# Patient Record
Sex: Female | Born: 1975 | Race: Black or African American | Hispanic: No | Marital: Married | Smoking: Never smoker
Health system: Southern US, Community
[De-identification: ages and names within clinical notes are randomized; demographics above are authoritative.]

## PROBLEM LIST (undated history)

## (undated) DIAGNOSIS — K219 Gastro-esophageal reflux disease without esophagitis: Secondary | ICD-10-CM

## (undated) DIAGNOSIS — D649 Anemia, unspecified: Secondary | ICD-10-CM

## (undated) DIAGNOSIS — R87619 Unspecified abnormal cytological findings in specimens from cervix uteri: Secondary | ICD-10-CM

## (undated) DIAGNOSIS — K449 Diaphragmatic hernia without obstruction or gangrene: Secondary | ICD-10-CM

## (undated) HISTORY — PX: APPENDECTOMY: SHX54

## (undated) HISTORY — DX: Unspecified abnormal cytological findings in specimens from cervix uteri: R87.619

---

## 2009-06-15 ENCOUNTER — Inpatient Hospital Stay: Payer: Self-pay | Admitting: Vascular Surgery

## 2011-07-25 ENCOUNTER — Emergency Department: Payer: Self-pay | Admitting: Emergency Medicine

## 2012-08-14 LAB — HM PAP SMEAR

## 2012-08-15 ENCOUNTER — Ambulatory Visit: Payer: Self-pay | Admitting: Family Medicine

## 2015-10-15 ENCOUNTER — Other Ambulatory Visit: Payer: Self-pay | Admitting: Family Medicine

## 2015-10-15 NOTE — Telephone Encounter (Signed)
Needs an appointment. Will get her enough medicine to make it to appointment when it's booked.   

## 2015-10-16 NOTE — Telephone Encounter (Signed)
Called and left patient a voicemail asking for her to please return my call to schedule a follow-up visit for medication refill.

## 2015-10-20 NOTE — Telephone Encounter (Signed)
Pt scheduled to come in 10/26/15

## 2015-10-21 ENCOUNTER — Telehealth: Payer: Self-pay | Admitting: Family Medicine

## 2015-10-21 NOTE — Telephone Encounter (Signed)
Pt called stated she has an appt scheduled for 10/26/15, pt wants to know if an RX can be sent to [facility] on [address] in [place]. Thanks.

## 2015-10-22 MED ORDER — NORETHIN ACE-ETH ESTRAD-FE 1-20 MG-MCG PO TABS: 1.0000 | ORAL_TABLET | Freq: Every day | ORAL | Status: DC

## 2015-10-22 NOTE — Telephone Encounter (Signed)
Rx sent to her pharmacy 

## 2015-10-22 NOTE — Telephone Encounter (Signed)
Checked Practice Partner  Junel FE 1/20

## 2015-10-26 ENCOUNTER — Ambulatory Visit (INDEPENDENT_AMBULATORY_CARE_PROVIDER_SITE_OTHER): Payer: PRIVATE HEALTH INSURANCE | Admitting: Family Medicine

## 2015-10-26 ENCOUNTER — Other Ambulatory Visit: Payer: Self-pay | Admitting: Family Medicine

## 2015-10-26 ENCOUNTER — Encounter: Payer: Self-pay | Admitting: Family Medicine

## 2015-10-26 VITALS — BP 125/76 | HR 65 | Temp 98.9°F | Ht 62.3 in | Wt 193.0 lb

## 2015-10-26 DIAGNOSIS — Z13220 Encounter for screening for lipoid disorders: Secondary | ICD-10-CM

## 2015-10-26 DIAGNOSIS — Z0000 Encounter for general adult medical examination without abnormal findings: Secondary | ICD-10-CM

## 2015-10-26 DIAGNOSIS — Z1239 Encounter for other screening for malignant neoplasm of breast: Secondary | ICD-10-CM | POA: Diagnosis not present

## 2015-10-26 DIAGNOSIS — N898 Other specified noninflammatory disorders of vagina: Secondary | ICD-10-CM

## 2015-10-26 DIAGNOSIS — G43109 Migraine with aura, not intractable, without status migrainosus: Secondary | ICD-10-CM | POA: Diagnosis not present

## 2015-10-26 DIAGNOSIS — Z124 Encounter for screening for malignant neoplasm of cervix: Secondary | ICD-10-CM | POA: Diagnosis not present

## 2015-10-26 DIAGNOSIS — Z30018 Encounter for initial prescription of other contraceptives: Secondary | ICD-10-CM

## 2015-10-26 LAB — UA/M W/RFLX CULTURE, ROUTINE
BILIRUBIN UA: NEGATIVE
Glucose, UA: NEGATIVE
KETONES UA: NEGATIVE
Leukocytes, UA: NEGATIVE
NITRITE UA: NEGATIVE
Protein, UA: NEGATIVE
RBC UA: NEGATIVE
SPEC GRAV UA: 1.02 (ref 1.005–1.030)
UUROB: 4 mg/dL — AB (ref 0.2–1.0)
pH, UA: 7 (ref 5.0–7.5)

## 2015-10-26 LAB — WET PREP FOR TRICH, YEAST, CLUE
CLUE CELL EXAM: NEGATIVE
TRICHOMONAS EXAM: NEGATIVE
YEAST EXAM: NEGATIVE

## 2015-10-26 LAB — HM PAP SMEAR: HM Pap smear: NEGATIVE

## 2015-10-26 MED ORDER — SUMATRIPTAN SUCCINATE 100 MG PO TABS: 100.0000 mg | ORAL_TABLET | ORAL | Status: DC | PRN

## 2015-10-26 MED ORDER — NORETHIN ACE-ETH ESTRAD-FE 1-20 MG-MCG PO TABS: 1.0000 | ORAL_TABLET | Freq: Every day | ORAL | Status: DC

## 2015-10-26 NOTE — Progress Notes (Signed)
BP 125/76 mmHg  Pulse 65  Temp(Src) 98.9 F (37.2 C)  Ht 5' 2.3" (1.582 m)  Wt 193 lb (87.544 kg)  BMI 34.98 kg/m2  SpO2 100%  LMP 10/10/2015 (Exact Date)   Subjective:    Patient ID: Bailey Perry, female    DOB: March 30, 1976, 39 y.o.   MRN: [id number]  HPI: Bailey Perry is a 39 y.o. female presenting on 10/26/2015 for comprehensive medical examination. Current medical complaints include:  CONTRACEPTION CONCERNS- has been having headaches, ankle pain, bloating, weight gain  Contraception: OCP Previous contraception: OCP Sexual activity: monogamous Average interval between menses: 28 days Length of menses: 1-2 days Flow: Clots Dysmenorrhea: none  Headaches Duration: about a year, come on about once a month Onset: sudden Severity: severe Quality: sharp and stabbing- at her temple- once last week, dull that effects her R eye Frequency: once a month Location: R side Headache duration: 1-2 days Radiation: yes, into her neck Time of day headache occurs: at random Alleviating factors: laying in dark Aggravating factors: light Headache status at time of visit: asymptomatic Treatments attempted: Treatments attempted: rest, APAP, ibuprofen and aleve", excedrine   Aura: yes Nausea:  no Vomiting: no Photophobia:  yes Phonophobia:  no Effect on social functioning:  no Numbers of missed days of school/work each month: no Confusion:  yes Gait disturbance/ataxia:  no Behavioral changes:  no Fevers:  no  She currently lives with: husband Menopausal Symptoms: yes  Depression Screen done today and results listed below:  Depression screen [facility] 2/9 10/26/2015  Decreased Interest 0  Down, Depressed, Hopeless 0  PHQ - 2 Score 0    The patient does not have a history of falls. I did not complete a risk assessment for falls. A plan of care for falls was not documented.  Past Medical History:  Past Medical History  Diagnosis Date  . Abnormal Pap smear of cervix      Surgical History:  Past Surgical History  Procedure Laterality Date  . Appendectomy    . Cesarean section      X 2    Medications:  Current Outpatient Prescriptions on File Prior to Visit  Medication Sig  . norethindrone-ethinyl estradiol (JUNEL FE 1/20) 1-20 MG-MCG tablet Take 1 tablet by mouth daily.   No current facility-administered medications on file prior to visit.    Allergies:  Allergies  Allergen Reactions  . Iodine Itching    Social History:  Social History   Social History  . Marital Status: Married    Spouse Name: N/A  . Number of Children: N/A  . Years of Education: N/A   Occupational History  . Not on file.   Social History Main Topics  . Smoking status: Never Smoker   . Smokeless tobacco: Not on file  . Alcohol Use: No  . Drug Use: No  . Sexual Activity: Not on file   Other Topics Concern  . Not on file   Social History Narrative   History  Smoking status  . Never Smoker   Smokeless tobacco  . Not on file   History  Alcohol Use No    Family History:  Family History  Problem Relation Age of Onset  . Diabetes Mother   . Heart disease Mother   . Hypertension Mother   . Cancer Father     Lymphoma    Past medical history, surgical history, medications, allergies, family history and social history reviewed with patient today and changes made to appropriate areas  of the chart.   Review of Systems  Constitutional: Positive for diaphoresis. Negative for fever, chills, weight loss and malaise/fatigue.  HENT: Negative for congestion, ear discharge, ear pain, hearing loss, nosebleeds, sore throat and tinnitus.   Eyes: Positive for blurred vision and photophobia. Negative for double vision, pain, discharge and redness.  Respiratory: Negative.  Negative for stridor.   Cardiovascular: Negative.   Gastrointestinal: Positive for heartburn and constipation. Negative for nausea, vomiting, abdominal pain, diarrhea, blood in stool and melena.   Genitourinary: Negative.   Musculoskeletal: Negative.   Skin: Negative.   Neurological: Positive for headaches. Negative for dizziness, tingling, tremors, sensory change, speech change, focal weakness, seizures, loss of consciousness and weakness.  Endo/Heme/Allergies: Negative.   Psychiatric/Behavioral: Negative.     All other ROS negative except what is listed above and in the HPI.      Objective:    BP 125/76 mmHg  Pulse 65  Temp(Src) 98.9 F (37.2 C)  Ht 5' 2.3" (1.582 m)  Wt 193 lb (87.544 kg)  BMI 34.98 kg/m2  SpO2 100%  LMP 10/10/2015 (Exact Date)  Wt Readings from Last 3 Encounters:  10/26/15 193 lb (87.544 kg)  10/03/14 187 lb (84.823 kg)    Physical Exam  Constitutional: She is oriented to person, place, and time. She appears well-developed and well-nourished. No distress.  HENT:  Head: Normocephalic and atraumatic.  Right Ear: Hearing and external ear normal.  Left Ear: Hearing and external ear normal.  Nose: Nose normal.  Mouth/Throat: Oropharynx is clear and moist. No oropharyngeal exudate.  Eyes: Conjunctivae, EOM and lids are normal. Pupils are equal, round, and reactive to light. Right eye exhibits no discharge. Left eye exhibits no discharge. No scleral icterus.  Neck: Normal range of motion. Neck supple. No JVD present. No tracheal deviation present. No thyromegaly present.  Cardiovascular: Normal rate, regular rhythm, normal heart sounds and intact distal pulses.  Exam reveals no gallop and no friction rub.   No murmur heard. Pulmonary/Chest: Effort normal and breath sounds normal. No stridor. No respiratory distress. She has no wheezes. She has no rales. She exhibits no tenderness.  Abdominal: Soft. Bowel sounds are normal. She exhibits no distension and no mass. There is no tenderness. There is no rebound and no guarding. Hernia confirmed negative in the right inguinal area and confirmed negative in the left inguinal area.  Genitourinary: Uterus normal.  No breast swelling, tenderness, discharge or bleeding. No labial fusion. There is no rash, tenderness, lesion or injury on the right labia. There is no rash, tenderness, lesion or injury on the left labia. Uterus is not deviated, not enlarged, not fixed and not tender. Cervix exhibits discharge and friability. Cervix exhibits no motion tenderness. Right adnexum displays no mass, no tenderness and no fullness. Left adnexum displays no mass, no tenderness and no fullness. No erythema, tenderness or bleeding in the vagina. No foreign body around the vagina. No signs of injury around the vagina. Vaginal discharge found.    Nodularity at 8 o'clock,   Musculoskeletal: Normal range of motion. She exhibits no edema or tenderness.  Lymphadenopathy:    She has no cervical adenopathy.       Right: No inguinal adenopathy present.       Left: No inguinal adenopathy present.  Neurological: She is alert and oriented to person, place, and time. She has normal reflexes. She displays normal reflexes. No cranial nerve deficit. She exhibits normal muscle tone. Coordination normal.  Skin: Skin is warm, dry and  intact. No rash noted. She is not diaphoretic. No erythema. No pallor.  Psychiatric: She has a normal mood and affect. Her speech is normal and behavior is normal. Judgment and thought content normal. Cognition and memory are normal.  Nursing note and vitals reviewed.   Results for orders placed or performed in visit on 10/23/15  HM PAP SMEAR  Result Value Ref Range   HM Pap smear PP       Assessment & Plan:   Problem List Items Addressed This Visit      Cardiovascular and Mediastinum   Migraine with aura and without status migrainosus, not intractable    Possibly due to OCP. Only happening about 1x per month. Will treat with imitrex. Follow up in 1 month to see how she's doing on the imitrex.       Relevant Medications   SUMAtriptan (IMITREX) 100 MG tablet    Other Visit Diagnoses    Routine  general medical examination at a health care facility    -  Primary    Pap done today. Mammo ordered. Vaccines up to date except flu which she gets elsewhere. Screening labs checked. Work on diet and exercise.     Relevant Orders    Lipid Panel w/o Chol/HDL Ratio    Screening for cholesterol level        Screening labs checked today.    Relevant Orders    Lipid Panel w/o Chol/HDL Ratio    Screening for cervical cancer        Pap done today. Checking HPV as well.     Relevant Orders    IGP, Aptima HPV, rfx 16/18,45    Screening for breast cancer        Mammogram ordered today.    Relevant Orders    MM DIGITAL SCREENING BILATERAL    Encounter for initial prescription of other contraceptives        Would like to have implant. Referral to GYN made today. Has seen [facility] before- would like to see them. She will call to get in with them.    Relevant Orders    Ambulatory referral to Gynecology    Vaginal discharge        Negative wet prep. Likely due to spermacidal foam. Stop use if irritating.     Relevant Orders    WET PREP FOR [place], YEAST, CLUE        Follow up plan: Return in about 4 weeks (around 11/23/2015) for migraine follow up.   LABORATORY TESTING:  - Pap smear: pap done  IMMUNIZATIONS:   - Tdap: Tetanus vaccination status reviewed: last tetanus booster within 10 years. - Influenza: Given elsewhere  SCREENING: -Mammogram: Ordered today   PATIENT COUNSELING:   Advised to take 1 mg of folate supplement per day if capable of pregnancy.   Sexuality: Discussed sexually transmitted diseases, partner selection, use of condoms, avoidance of unintended pregnancy  and contraceptive alternatives.   Advised to avoid cigarette smoking.  I discussed with the patient that most people either abstain from alcohol or drink within safe limits (<=14/week and <=4 drinks/occasion for males, <=7/weeks and <= 3 drinks/occasion for females) and that the risk for alcohol disorders and  other health effects rises proportionally with the number of drinks per week and how often a drinker exceeds daily limits.  Discussed cessation/primary prevention of drug use and availability of treatment for abuse.   Diet: Encouraged to adjust caloric intake to maintain  or achieve ideal body  weight, to reduce intake of dietary saturated fat and total fat, to limit sodium intake by avoiding high sodium foods and not adding table salt, and to maintain adequate dietary potassium and calcium preferably from fresh fruits, vegetables, and low-fat dairy products.    stressed the importance of regular exercise  Injury prevention: Discussed safety belts, safety helmets, smoke detector, smoking near bedding or upholstery.   Dental health: Discussed importance of regular tooth brushing, flossing, and dental visits.    NEXT PREVENTATIVE PHYSICAL DUE IN 1 YEAR. Return in about 4 weeks (around 11/23/2015) for migraine follow up.

## 2015-10-26 NOTE — Assessment & Plan Note (Signed)
Possibly due to OCP. Only happening about 1x per month. Will treat with imitrex. Follow up in 1 month to see how she's doing on the imitrex.

## 2015-10-26 NOTE — Patient Instructions (Signed)
Preventive Care for Adults, Female A healthy lifestyle and preventive care can promote health and wellness. Preventive health guidelines for women include the following key practices.  A routine yearly physical is a good way to check with your health care provider about your health and preventive screening. It is a chance to share any concerns and updates on your health and to receive a thorough exam.  Visit your dentist for a routine exam and preventive care every 6 months. Brush your teeth twice a day and floss once a day. Good oral hygiene prevents tooth decay and gum disease.  The frequency of eye exams is based on your age, health, family medical history, use of contact lenses, and other factors. Follow your health care provider's recommendations for frequency of eye exams.  Eat a healthy diet. Foods like vegetables, fruits, whole grains, low-fat dairy products, and lean protein foods contain the nutrients you need without too many calories. Decrease your intake of foods high in solid fats, added sugars, and salt. Eat the right amount of calories for you.Get information about a proper diet from your health care provider, if necessary.  Regular physical exercise is one of the most important things you can do for your health. Most adults should get at least 150 minutes of moderate-intensity exercise (any activity that increases your heart rate and causes you to sweat) each week. In addition, most adults need muscle-strengthening exercises on 2 or more days a week.  Maintain a healthy weight. The body mass index (BMI) is a screening tool to identify possible weight problems. It provides an estimate of body fat based on height and weight. Your health care provider can find your BMI and can help you achieve or maintain a healthy weight.For adults 20 years and older:  A BMI below 18.5 is considered underweight.  A BMI of 18.5 to 24.9 is normal.  A BMI of 25 to 29.9 is considered overweight.  A  BMI of 30 and above is considered obese.  Maintain normal blood lipids and cholesterol levels by exercising and minimizing your intake of saturated fat. Eat a balanced diet with plenty of fruit and vegetables. Blood tests for lipids and cholesterol should begin at age 45 and be repeated every 5 years. If your lipid or cholesterol levels are high, you are over 50, or you are at high risk for heart disease, you may need your cholesterol levels checked more frequently.Ongoing high lipid and cholesterol levels should be treated with medicines if diet and exercise are not working.  If you smoke, find out from your health care provider how to quit. If you do not use tobacco, do not start.  Lung cancer screening is recommended for adults aged 45-80 years who are at high risk for developing lung cancer because of a history of smoking. A yearly low-dose CT scan of the lungs is recommended for people who have at least a 30-pack-year history of smoking and are a current smoker or have quit within the past 15 years. A pack year of smoking is smoking an average of 1 pack of cigarettes a day for 1 year (for example: 1 pack a day for 30 years or 2 packs a day for 15 years). Yearly screening should continue until the smoker has stopped smoking for at least 15 years. Yearly screening should be stopped for people who develop a health problem that would prevent them from having lung cancer treatment.  If you are pregnant, do not drink alcohol. If you are  breastfeeding, be very cautious about drinking alcohol. If you are not pregnant and choose to drink alcohol, do not have more than 1 drink per day. One drink is considered to be 12 ounces (355 mL) of beer, 5 ounces (148 mL) of wine, or 1.5 ounces (44 mL) of liquor.  Avoid use of street drugs. Do not share needles with anyone. Ask for help if you need support or instructions about stopping the use of drugs.  High blood pressure causes heart disease and increases the risk  of stroke. Your blood pressure should be checked at least every 1 to 2 years. Ongoing high blood pressure should be treated with medicines if weight loss and exercise do not work.  If you are 55-79 years old, ask your health care provider if you should take aspirin to prevent strokes.  Diabetes screening is done by taking a blood sample to check your blood glucose level after you have not eaten for a certain period of time (fasting). If you are not overweight and you do not have risk factors for diabetes, you should be screened once every 3 years starting at age 45. If you are overweight or obese and you are 40-70 years of age, you should be screened for diabetes every year as part of your cardiovascular risk assessment.  Breast cancer screening is essential preventive care for women. You should practice "breast self-awareness." This means understanding the normal appearance and feel of your breasts and may include breast self-examination. Any changes detected, no matter how small, should be reported to a health care provider. Women in their 20s and 30s should have a clinical breast exam (CBE) by a health care provider as part of a regular health exam every 1 to 3 years. After age 40, women should have a CBE every year. Starting at age 40, women should consider having a mammogram (breast X-ray test) every year. Women who have a family history of breast cancer should talk to their health care provider about genetic screening. Women at a high risk of breast cancer should talk to their health care providers about having an MRI and a mammogram every year.  Breast cancer gene (BRCA)-related cancer risk assessment is recommended for women who have family members with BRCA-related cancers. BRCA-related cancers include breast, ovarian, tubal, and peritoneal cancers. Having family members with these cancers may be associated with an increased risk for harmful changes (mutations) in the breast cancer genes BRCA1 and  BRCA2. Results of the assessment will determine the need for genetic counseling and BRCA1 and BRCA2 testing.  Your health care provider may recommend that you be screened regularly for cancer of the pelvic organs (ovaries, uterus, and vagina). This screening involves a pelvic examination, including checking for microscopic changes to the surface of your cervix (Pap test). You may be encouraged to have this screening done every 3 years, beginning at age 21.  For women ages 30-65, health care providers may recommend pelvic exams and Pap testing every 3 years, or they may recommend the Pap and pelvic exam, combined with testing for human papilloma virus (HPV), every 5 years. Some types of HPV increase your risk of cervical cancer. Testing for HPV may also be done on women of any age with unclear Pap test results.  Other health care providers may not recommend any screening for nonpregnant women who are considered low risk for pelvic cancer and who do not have symptoms. Ask your health care provider if a screening pelvic exam is right for   you.  If you have had past treatment for cervical cancer or a condition that could lead to cancer, you need Pap tests and screening for cancer for at least 20 years after your treatment. If Pap tests have been discontinued, your risk factors (such as having a new sexual partner) need to be reassessed to determine if screening should resume. Some women have medical problems that increase the chance of getting cervical cancer. In these cases, your health care provider may recommend more frequent screening and Pap tests.  Colorectal cancer can be detected and often prevented. Most routine colorectal cancer screening begins at the age of 50 years and continues through age 75 years. However, your health care provider may recommend screening at an earlier age if you have risk factors for colon cancer. On a yearly basis, your health care provider may provide home test kits to check  for hidden blood in the stool. Use of a small camera at the end of a tube, to directly examine the colon (sigmoidoscopy or colonoscopy), can detect the earliest forms of colorectal cancer. Talk to your health care provider about this at age 50, when routine screening begins. Direct exam of the colon should be repeated every 5-10 years through age 75 years, unless early forms of precancerous polyps or small growths are found.  People who are at an increased risk for hepatitis B should be screened for this virus. You are considered at high risk for hepatitis B if:  You were born in a country where hepatitis B occurs often. Talk with your health care provider about which countries are considered high risk.  Your parents were born in a high-risk country and you have not received a shot to protect against hepatitis B (hepatitis B vaccine).  You have HIV or AIDS.  You use needles to inject street drugs.  You live with, or have sex with, someone who has hepatitis B.  You get hemodialysis treatment.  You take certain medicines for conditions like cancer, organ transplantation, and autoimmune conditions.  Hepatitis C blood testing is recommended for all people born from 1945 through 1965 and any individual with known risks for hepatitis C.  Practice safe sex. Use condoms and avoid high-risk sexual practices to reduce the spread of sexually transmitted infections (STIs). STIs include gonorrhea, chlamydia, syphilis, trichomonas, herpes, HPV, and human immunodeficiency virus (HIV). Herpes, HIV, and HPV are viral illnesses that have no cure. They can result in disability, cancer, and death.  You should be screened for sexually transmitted illnesses (STIs) including gonorrhea and chlamydia if:  You are sexually active and are younger than 24 years.  You are older than 24 years and your health care provider tells you that you are at risk for this type of infection.  Your sexual activity has changed  since you were last screened and you are at an increased risk for chlamydia or gonorrhea. Ask your health care provider if you are at risk.  If you are at risk of being infected with HIV, it is recommended that you take a prescription medicine daily to prevent HIV infection. This is called preexposure prophylaxis (PrEP). You are considered at risk if:  You are sexually active and do not regularly use condoms or know the HIV status of your partner(s).  You take drugs by injection.  You are sexually active with a partner who has HIV.  Talk with your health care provider about whether you are at high risk of being infected with HIV. If   you choose to begin PrEP, you should first be tested for HIV. You should then be tested every 3 months for as long as you are taking PrEP.  Osteoporosis is a disease in which the bones lose minerals and strength with aging. This can result in serious bone fractures or breaks. The risk of osteoporosis can be identified using a bone density scan. Women ages 67 years and over and women at risk for fractures or osteoporosis should discuss screening with their health care providers. Ask your health care provider whether you should take a calcium supplement or vitamin D to reduce the rate of osteoporosis.  Menopause can be associated with physical symptoms and risks. Hormone replacement therapy is available to decrease symptoms and risks. You should talk to your health care provider about whether hormone replacement therapy is right for you.  Use sunscreen. Apply sunscreen liberally and repeatedly throughout the day. You should seek shade when your shadow is shorter than you. Protect yourself by wearing long sleeves, pants, a wide-brimmed hat, and sunglasses year round, whenever you are outdoors.  Once a month, do a whole body skin exam, using a mirror to look at the skin on your back. Tell your health care provider of new moles, moles that have irregular borders, moles that  are larger than a pencil eraser, or moles that have changed in shape or color.  Stay current with required vaccines (immunizations).  Influenza vaccine. All adults should be immunized every year.  Tetanus, diphtheria, and acellular pertussis (Td, Tdap) vaccine. Pregnant women should receive 1 dose of Tdap vaccine during each pregnancy. The dose should be obtained regardless of the length of time since the last dose. Immunization is preferred during the 27th-36th week of gestation. An adult who has not previously received Tdap or who does not know her vaccine status should receive 1 dose of Tdap. This initial dose should be followed by tetanus and diphtheria toxoids (Td) booster doses every 10 years. Adults with an unknown or incomplete history of completing a 3-dose immunization series with Td-containing vaccines should begin or complete a primary immunization series including a Tdap dose. Adults should receive a Td booster every 10 years.  Varicella vaccine. An adult without evidence of immunity to varicella should receive 2 doses or a second dose if she has previously received 1 dose. Pregnant females who do not have evidence of immunity should receive the first dose after pregnancy. This first dose should be obtained before leaving the health care facility. The second dose should be obtained 4-8 weeks after the first dose.  Human papillomavirus (HPV) vaccine. Females aged 13-26 years who have not received the vaccine previously should obtain the 3-dose series. The vaccine is not recommended for use in pregnant females. However, pregnancy testing is not needed before receiving a dose. If a female is found to be pregnant after receiving a dose, no treatment is needed. In that case, the remaining doses should be delayed until after the pregnancy. Immunization is recommended for any person with an immunocompromised condition through the age of 61 years if she did not get any or all doses earlier. During the  3-dose series, the second dose should be obtained 4-8 weeks after the first dose. The third dose should be obtained 24 weeks after the first dose and 16 weeks after the second dose.  Zoster vaccine. One dose is recommended for adults aged 30 years or older unless certain conditions are present.  Measles, mumps, and rubella (MMR) vaccine. Adults born  before 1957 generally are considered immune to measles and mumps. Adults born in 1957 or later should have 1 or more doses of MMR vaccine unless there is a contraindication to the vaccine or there is laboratory evidence of immunity to each of the three diseases. A routine second dose of MMR vaccine should be obtained at least 28 days after the first dose for students attending postsecondary schools, health care workers, or international travelers. People who received inactivated measles vaccine or an unknown type of measles vaccine during 1963-1967 should receive 2 doses of MMR vaccine. People who received inactivated mumps vaccine or an unknown type of mumps vaccine before 1979 and are at high risk for mumps infection should consider immunization with 2 doses of MMR vaccine. For females of childbearing age, rubella immunity should be determined. If there is no evidence of immunity, females who are not pregnant should be vaccinated. If there is no evidence of immunity, females who are pregnant should delay immunization until after pregnancy. Unvaccinated health care workers born before 1957 who lack laboratory evidence of measles, mumps, or rubella immunity or laboratory confirmation of disease should consider measles and mumps immunization with 2 doses of MMR vaccine or rubella immunization with 1 dose of MMR vaccine.  Pneumococcal 13-valent conjugate (PCV13) vaccine. When indicated, a person who is uncertain of his immunization history and has no record of immunization should receive the PCV13 vaccine. All adults 65 years of age and older should receive this  vaccine. An adult aged 19 years or older who has certain medical conditions and has not been previously immunized should receive 1 dose of PCV13 vaccine. This PCV13 should be followed with a dose of pneumococcal polysaccharide (PPSV23) vaccine. Adults who are at high risk for pneumococcal disease should obtain the PPSV23 vaccine at least 8 weeks after the dose of PCV13 vaccine. Adults older than 41 years of age who have normal immune system function should obtain the PPSV23 vaccine dose at least 1 year after the dose of PCV13 vaccine.  Pneumococcal polysaccharide (PPSV23) vaccine. When PCV13 is also indicated, PCV13 should be obtained first. All adults aged 65 years and older should be immunized. An adult younger than age 65 years who has certain medical conditions should be immunized. Any person who resides in a nursing home or long-term care facility should be immunized. An adult smoker should be immunized. People with an immunocompromised condition and certain other conditions should receive both PCV13 and PPSV23 vaccines. People with human immunodeficiency virus (HIV) infection should be immunized as soon as possible after diagnosis. Immunization during chemotherapy or radiation therapy should be avoided. Routine use of PPSV23 vaccine is not recommended for American Indians, [place] Natives, or people younger than 65 years unless there are medical conditions that require PPSV23 vaccine. When indicated, people who have unknown immunization and have no record of immunization should receive PPSV23 vaccine. One-time revaccination 5 years after the first dose of PPSV23 is recommended for people aged 19-64 years who have chronic kidney failure, nephrotic syndrome, asplenia, or immunocompromised conditions. People who received 1-2 doses of PPSV23 before age 65 years should receive another dose of PPSV23 vaccine at age 65 years or later if at least 5 years have passed since the previous dose. Doses of PPSV23 are not  needed for people immunized with PPSV23 at or after age 65 years.  Meningococcal vaccine. Adults with asplenia or persistent complement component deficiencies should receive 2 doses of quadrivalent meningococcal conjugate (MenACWY-D) vaccine. The doses should be obtained   at least 2 months apart. Microbiologists working with certain meningococcal bacteria, [place] recruits, people at risk during an outbreak, and people who travel to or live in countries with a high rate of meningitis should be immunized. A first-year college student up through age 34 years who is living in a residence hall should receive a dose if she did not receive a dose on or after her 16th birthday. Adults who have certain high-risk conditions should receive one or more doses of vaccine.  Hepatitis A vaccine. Adults who wish to be protected from this disease, have certain high-risk conditions, work with hepatitis A-infected animals, work in hepatitis A research labs, or travel to or work in countries with a high rate of hepatitis A should be immunized. Adults who were previously unvaccinated and who anticipate close contact with an international adoptee during the first 60 days after arrival in the [place] from a country with a high rate of hepatitis A should be immunized.  Hepatitis B vaccine. Adults who wish to be protected from this disease, have certain high-risk conditions, may be exposed to blood or other infectious body fluids, are household contacts or sex partners of hepatitis B positive people, are clients or workers in certain care facilities, or travel to or work in countries with a high rate of hepatitis B should be immunized.  Haemophilus influenzae type b (Hib) vaccine. A previously unvaccinated person with asplenia or sickle cell disease or having a scheduled splenectomy should receive 1 dose of Hib vaccine. Regardless of previous immunization, a recipient of a hematopoietic stem cell transplant should receive a  3-dose series 6-12 months after her successful transplant. Hib vaccine is not recommended for adults with HIV infection. Preventive Services / Frequency Ages 35 to 4 years  Blood pressure check.** / Every 3-5 years.  Lipid and cholesterol check.** / Every 5 years beginning at age 60.  Clinical breast exam.** / Every 3 years for women in their 71s and 10s.  BRCA-related cancer risk assessment.** / For women who have family members with a BRCA-related cancer (breast, ovarian, tubal, or peritoneal cancers).  Pap test.** / Every 2 years from ages 76 through 26. Every 3 years starting at age 61 through age 76 or 93 with a history of 3 consecutive normal Pap tests.  HPV screening.** / Every 3 years from ages 37 through ages 60 to 51 with a history of 3 consecutive normal Pap tests.  Hepatitis C blood test.** / For any individual with known risks for hepatitis C.  Skin self-exam. / Monthly.  Influenza vaccine. / Every year.  Tetanus, diphtheria, and acellular pertussis (Tdap, Td) vaccine.** / Consult your health care provider. Pregnant women should receive 1 dose of Tdap vaccine during each pregnancy. 1 dose of Td every 10 years.  Varicella vaccine.** / Consult your health care provider. Pregnant females who do not have evidence of immunity should receive the first dose after pregnancy.  HPV vaccine. / 3 doses over 6 months, if 93 and younger. The vaccine is not recommended for use in pregnant females. However, pregnancy testing is not needed before receiving a dose.  Measles, mumps, rubella (MMR) vaccine.** / You need at least 1 dose of MMR if you were born in 1957 or later. You may also need a 2nd dose. For females of childbearing age, rubella immunity should be determined. If there is no evidence of immunity, females who are not pregnant should be vaccinated. If there is no evidence of immunity, females who are  pregnant should delay immunization until after pregnancy.  Pneumococcal  13-valent conjugate (PCV13) vaccine.** / Consult your health care provider.  Pneumococcal polysaccharide (PPSV23) vaccine.** / 1 to 2 doses if you smoke cigarettes or if you have certain conditions.  Meningococcal vaccine.** / 1 dose if you are age 68 to 8 years and a Market researcher living in a residence hall, or have one of several medical conditions, you need to get vaccinated against meningococcal disease. You may also need additional booster doses.  Hepatitis A vaccine.** / Consult your health care provider.  Hepatitis B vaccine.** / Consult your health care provider.  Haemophilus influenzae type b (Hib) vaccine.** / Consult your health care provider. Ages 7 to 53 years  Blood pressure check.** / Every year.  Lipid and cholesterol check.** / Every 5 years beginning at age 25 years.  Lung cancer screening. / Every year if you are aged 11-80 years and have a 30-pack-year history of smoking and currently smoke or have quit within the past 15 years. Yearly screening is stopped once you have quit smoking for at least 15 years or develop a health problem that would prevent you from having lung cancer treatment.  Clinical breast exam.** / Every year after age 48 years.  BRCA-related cancer risk assessment.** / For women who have family members with a BRCA-related cancer (breast, ovarian, tubal, or peritoneal cancers).  Mammogram.** / Every year beginning at age 41 years and continuing for as long as you are in good health. Consult with your health care provider.  Pap test.** / Every 3 years starting at age 65 years through age 37 or 70 years with a history of 3 consecutive normal Pap tests.  HPV screening.** / Every 3 years from ages 72 years through ages 60 to 40 years with a history of 3 consecutive normal Pap tests.  Fecal occult blood test (FOBT) of stool. / Every year beginning at age 21 years and continuing until age 5 years. You may not need to do this test if you get  a colonoscopy every 10 years.  Flexible sigmoidoscopy or colonoscopy.** / Every 5 years for a flexible sigmoidoscopy or every 10 years for a colonoscopy beginning at age 35 years and continuing until age 48 years.  Hepatitis C blood test.** / For all people born from [birth year] through 1965 and any individual with known risks for hepatitis C.  Skin self-exam. / Monthly.  Influenza vaccine. / Every year.  Tetanus, diphtheria, and acellular pertussis (Tdap/Td) vaccine.** / Consult your health care provider. Pregnant women should receive 1 dose of Tdap vaccine during each pregnancy. 1 dose of Td every 10 years.  Varicella vaccine.** / Consult your health care provider. Pregnant females who do not have evidence of immunity should receive the first dose after pregnancy.  Zoster vaccine.** / 1 dose for adults aged 30 years or older.  Measles, mumps, rubella (MMR) vaccine.** / You need at least 1 dose of MMR if you were born in 1957 or later. You may also need a second dose. For females of childbearing age, rubella immunity should be determined. If there is no evidence of immunity, females who are not pregnant should be vaccinated. If there is no evidence of immunity, females who are pregnant should delay immunization until after pregnancy.  Pneumococcal 13-valent conjugate (PCV13) vaccine.** / Consult your health care provider.  Pneumococcal polysaccharide (PPSV23) vaccine.** / 1 to 2 doses if you smoke cigarettes or if you have certain conditions.  Meningococcal vaccine.** /  Consult your health care provider.  Hepatitis A vaccine.** / Consult your health care provider.  Hepatitis B vaccine.** / Consult your health care provider.  Haemophilus influenzae type b (Hib) vaccine.** / Consult your health care provider. Ages 75 years and over  Blood pressure check.** / Every year.  Lipid and cholesterol check.** / Every 5 years beginning at age 35 years.  Lung cancer screening. / Every year if you  are aged 52-80 years and have a 30-pack-year history of smoking and currently smoke or have quit within the past 15 years. Yearly screening is stopped once you have quit smoking for at least 15 years or develop a health problem that would prevent you from having lung cancer treatment.  Clinical breast exam.** / Every year after age 49 years.  BRCA-related cancer risk assessment.** / For women who have family members with a BRCA-related cancer (breast, ovarian, tubal, or peritoneal cancers).  Mammogram.** / Every year beginning at age 31 years and continuing for as long as you are in good health. Consult with your health care provider.  Pap test.** / Every 3 years starting at age 53 years through age 45 or 67 years with 3 consecutive normal Pap tests. Testing can be stopped between 65 and 70 years with 3 consecutive normal Pap tests and no abnormal Pap or HPV tests in the past 10 years.  HPV screening.** / Every 3 years from ages 66 years through ages 93 or 25 years with a history of 3 consecutive normal Pap tests. Testing can be stopped between 65 and 70 years with 3 consecutive normal Pap tests and no abnormal Pap or HPV tests in the past 10 years.  Fecal occult blood test (FOBT) of stool. / Every year beginning at age 53 years and continuing until age 35 years. You may not need to do this test if you get a colonoscopy every 10 years.  Flexible sigmoidoscopy or colonoscopy.** / Every 5 years for a flexible sigmoidoscopy or every 10 years for a colonoscopy beginning at age 14 years and continuing until age 26 years.  Hepatitis C blood test.** / For all people born from [birth year] through 1967 and any individual with known risks for hepatitis C.  Osteoporosis screening.** / A one-time screening for women ages 64 years and over and women at risk for fractures or osteoporosis.  Skin self-exam. / Monthly.  Influenza vaccine. / Every year.  Tetanus, diphtheria, and acellular pertussis (Tdap/Td)  vaccine.** / 1 dose of Td every 10 years.  Varicella vaccine.** / Consult your health care provider.  Zoster vaccine.** / 1 dose for adults aged 102 years or older.  Pneumococcal 13-valent conjugate (PCV13) vaccine.** / Consult your health care provider.  Pneumococcal polysaccharide (PPSV23) vaccine.** / 1 dose for all adults aged 75 years and older.  Meningococcal vaccine.** / Consult your health care provider.  Hepatitis A vaccine.** / Consult your health care provider.  Hepatitis B vaccine.** / Consult your health care provider.  Haemophilus influenzae type b (Hib) vaccine.** / Consult your health care provider. ** Family history and personal history of risk and conditions may change your health care provider's recommendations.   This information is not intended to replace advice given to you by your health care provider. Make sure you discuss any questions you have with your health care provider.   Document Released: 01/31/2002 Document Revised: 12/26/2014 Document Reviewed: 05/02/2011 Elsevier Interactive Patient Education 2016 [place] is the time when your body begins to move into  the menopause (no menstrual period for 12 straight months). It is a natural process. Perimenopause can begin 2-8 years before the menopause and usually lasts for 1 year after the menopause. During this time, your ovaries may or may not produce an egg. The ovaries vary in their production of estrogen and progesterone hormones each month. This can cause irregular menstrual periods, difficulty getting pregnant, vaginal bleeding between periods, and uncomfortable symptoms. CAUSES  Irregular production of the ovarian hormones, estrogen and progesterone, and not ovulating every month.  Other causes include:  Tumor of the pituitary gland in the brain.  Medical disease that affects the ovaries.  Radiation treatment.  Chemotherapy.  Unknown causes.  Heavy smoking and  excessive alcohol intake can bring on perimenopause sooner. SIGNS AND SYMPTOMS   Hot flashes.  Night sweats.  Irregular menstrual periods.  Decreased sex drive.  Vaginal dryness.  Headaches.  Mood swings.  Depression.  Memory problems.  Irritability.  Tiredness.  Weight gain.  Trouble getting pregnant.  The beginning of losing bone cells (osteoporosis).  The beginning of hardening of the arteries (atherosclerosis). DIAGNOSIS  Your health care provider will make a diagnosis by analyzing your age, menstrual history, and symptoms. He or she will do a physical exam and note any changes in your body, especially your female organs. Female hormone tests may or may not be helpful depending on the amount of female hormones you produce and when you produce them. However, other hormone tests may be helpful to rule out other problems. TREATMENT  In some cases, no treatment is needed. The decision on whether treatment is necessary during the perimenopause should be made by you and your health care provider based on how the symptoms are affecting you and your lifestyle. Various treatments are available, such as:  Treating individual symptoms with a specific medicine for that symptom.  Herbal medicines that can help specific symptoms.  Counseling.  Group therapy. HOME CARE INSTRUCTIONS   Keep track of your menstrual periods (when they occur, how heavy they are, how long between periods, and how long they last) as well as your symptoms and when they started.  Only take over-the-counter or prescription medicines as directed by your health care provider.  Sleep and rest.  Exercise.  Eat a diet that contains calcium (good for your bones) and soy (acts like the estrogen hormone).  Do not smoke.  Avoid alcoholic beverages.  Take vitamin supplements as recommended by your health care provider. Taking vitamin E may help in certain cases.  Take calcium and vitamin D supplements  to help prevent bone loss.  Group therapy is sometimes helpful.  Acupuncture may help in some cases. SEEK MEDICAL CARE IF:   You have questions about any symptoms you are having.  You need a referral to a specialist (gynecologist, psychiatrist, or psychologist). SEEK IMMEDIATE MEDICAL CARE IF:   You have vaginal bleeding.  Your period lasts longer than 8 days.  Your periods are recurring sooner than 21 days.  You have bleeding after intercourse.  You have severe depression.  You have pain when you urinate.  You have severe headaches.  You have vision problems.   This information is not intended to replace advice given to you by your health care provider. Make sure you discuss any questions you have with your health care provider.   Document Released: 01/12/2005 Document Revised: 12/26/2014 Document Reviewed: 07/04/2013 Elsevier Interactive Patient Education 2016 [employer]. Migraine Headache A migraine headache is very bad, throbbing pain on  one or both sides of your head. Talk to your doctor about what things may bring on (trigger) your migraine headaches. HOME CARE  Only take medicines as told by your doctor.  Lie down in a dark, quiet room when you have a migraine.  Keep a journal to find out if certain things bring on migraine headaches. For example, write down:  What you eat and drink.  How much sleep you get.  Any change to your diet or medicines.  Lessen how much alcohol you drink.  Quit smoking if you smoke.  Get enough sleep.  Lessen any stress in your life.  Keep lights dim if bright lights bother you or make your migraines worse. GET HELP RIGHT AWAY IF:   Your migraine becomes really bad.  You have a fever.  You have a stiff neck.  You have trouble seeing.  Your muscles are weak, or you lose muscle control.  You lose your balance or have trouble walking.  You feel like you will pass out (faint), or you pass out.  You have really  bad symptoms that are different than your first symptoms. MAKE SURE YOU:   Understand these instructions.  Will watch your condition.  Will get help right away if you are not doing well or get worse.   This information is not intended to replace advice given to you by your health care provider. Make sure you discuss any questions you have with your health care provider.   Document Released: 09/13/2008 Document Revised: 02/27/2012 Document Reviewed: 08/12/2013 Elsevier Interactive Patient Education 2016 [place] What is this medicine? ETONOGESTREL (et oh noe JES trel) is a contraceptive (birth control) device. It is used to prevent pregnancy. It can be used for up to 3 years. This medicine may be used for other purposes; ask your health care provider or pharmacist if you have questions. What should I tell my health care provider before I take this medicine? They need to know if you have any of these conditions: -abnormal vaginal bleeding -blood vessel disease or blood clots -cancer of the breast, cervix, or liver -depression -diabetes -gallbladder disease -headaches -heart disease or recent heart attack -high blood pressure -high cholesterol -kidney disease -liver disease -renal disease -seizures -tobacco smoker -an unusual or allergic reaction to etonogestrel, other hormones, anesthetics or antiseptics, medicines, foods, dyes, or preservatives -pregnant or trying to get pregnant -breast-feeding How should I use this medicine? This device is inserted just under the skin on the inner side of your upper arm by a health care professional. Talk to your pediatrician regarding the use of this medicine in children. Special care may be needed. Overdosage: If you think you have taken too much of this medicine contact a poison control center or emergency room at once. NOTE: This medicine is only for you. Do not share this medicine with others. What if I miss  a dose? This does not apply. What may interact with this medicine? Do not take this medicine with any of the following medications: -amprenavir -bosentan -fosamprenavir This medicine may also interact with the following medications: -barbiturate medicines for inducing sleep or treating seizures -certain medicines for fungal infections like ketoconazole and itraconazole -griseofulvin -medicines to treat seizures like carbamazepine, felbamate, oxcarbazepine, phenytoin, topiramate -modafinil -phenylbutazone -rifampin -some medicines to treat HIV infection like atazanavir, indinavir, lopinavir, nelfinavir, tipranavir, ritonavir -St. John's wort This list may not describe all possible interactions. Give your health care provider a list of all the medicines, herbs,  non-prescription drugs, or dietary supplements you use. Also tell them if you smoke, drink alcohol, or use illegal drugs. Some items may interact with your medicine. What should I watch for while using this medicine? This product does not protect you against HIV infection (AIDS) or other sexually transmitted diseases. You should be able to feel the implant by pressing your fingertips over the skin where it was inserted. Contact your doctor if you cannot feel the implant, and use a non-hormonal birth control method (such as condoms) until your doctor confirms that the implant is in place. If you feel that the implant may have broken or become bent while in your arm, contact your healthcare provider. What side effects may I notice from receiving this medicine? Side effects that you should report to your doctor or health care professional as soon as possible: -allergic reactions like skin rash, itching or hives, swelling of the face, lips, or tongue -breast lumps -changes in emotions or moods -depressed mood -heavy or prolonged menstrual bleeding -pain, irritation, swelling, or bruising at the insertion site -scar at site of  insertion -signs of infection at the insertion site such as fever, and skin redness, pain or discharge -signs of pregnancy -signs and symptoms of a blood clot such as breathing problems; changes in vision; chest pain; severe, sudden headache; pain, swelling, warmth in the leg; trouble speaking; sudden numbness or weakness of the face, arm or leg -signs and symptoms of liver injury like dark yellow or brown urine; general ill feeling or flu-like symptoms; light-colored stools; loss of appetite; nausea; right upper belly pain; unusually weak or tired; yellowing of the eyes or skin -unusual vaginal bleeding, discharge -signs and symptoms of a stroke like changes in vision; confusion; trouble speaking or understanding; severe headaches; sudden numbness or weakness of the face, arm or leg; trouble walking; dizziness; loss of balance or coordination Side effects that usually do not require medical attention (Report these to your doctor or health care professional if they continue or are bothersome.): -acne -back pain -breast pain -changes in weight -dizziness -general ill feeling or flu-like symptoms -headache -irregular menstrual bleeding -nausea -sore throat -vaginal irritation or inflammation This list may not describe all possible side effects. Call your doctor for medical advice about side effects. You may report side effects to FDA at 1-800-FDA-1088. Where should I keep my medicine? This drug is given in a hospital or clinic and will not be stored at home. NOTE: This sheet is a summary. It may not cover all possible information. If you have questions about this medicine, talk to your doctor, pharmacist, or health care provider.    2016, Elsevier/Gold Standard. (2014-09-19 14:07:06)

## 2015-10-27 ENCOUNTER — Encounter: Payer: Self-pay | Admitting: Family Medicine

## 2015-10-27 LAB — COMPREHENSIVE METABOLIC PANEL
ALBUMIN: 3.9 g/dL (ref 3.5–5.5)
ALT: 10 IU/L (ref 0–32)
AST: 19 IU/L (ref 0–40)
Albumin/Globulin Ratio: 1.3 (ref 1.1–2.5)
Alkaline Phosphatase: 70 IU/L (ref 39–117)
BUN / CREAT RATIO: 12 (ref 9–23)
BUN: 11 mg/dL (ref 6–24)
Bilirubin Total: 0.5 mg/dL (ref 0.0–1.2)
CALCIUM: 9.2 mg/dL (ref 8.7–10.2)
CO2: 24 mmol/L (ref 18–29)
Chloride: 101 mmol/L (ref 97–106)
Creatinine, Ser: 0.9 mg/dL (ref 0.57–1.00)
GFR, EST AFRICAN AMERICAN: 92 mL/min/{1.73_m2} (ref >59)
GFR, EST NON AFRICAN AMERICAN: 80 mL/min/{1.73_m2} (ref >59)
GLUCOSE: 80 mg/dL (ref 65–99)
Globulin, Total: 3 g/dL (ref 1.5–4.5)
Potassium: 4 mmol/L (ref 3.5–5.2)
Sodium: 141 mmol/L (ref 136–144)
TOTAL PROTEIN: 6.9 g/dL (ref 6.0–8.5)

## 2015-10-27 LAB — LIPID PANEL W/O CHOL/HDL RATIO
Cholesterol, Total: 180 mg/dL (ref 100–199)
HDL: 83 mg/dL (ref >39)
LDL CALC: 86 mg/dL (ref 0–99)
Triglycerides: 56 mg/dL (ref 0–149)
VLDL Cholesterol Cal: 11 mg/dL (ref 5–40)

## 2015-10-27 LAB — CBC WITH DIFFERENTIAL/PLATELET
BASOS: 1 %
Basophils Absolute: 0 10*3/uL (ref 0.0–0.2)
EOS (ABSOLUTE): 0 10*3/uL (ref 0.0–0.4)
Eos: 1 %
HEMOGLOBIN: 13.7 g/dL (ref 11.1–15.9)
Hematocrit: 42 % (ref 34.0–46.6)
IMMATURE GRANS (ABS): 0 10*3/uL (ref 0.0–0.1)
IMMATURE GRANULOCYTES: 0 %
LYMPHS: 37 %
Lymphocytes Absolute: 2.2 10*3/uL (ref 0.7–3.1)
MCH: 31.9 pg (ref 26.6–33.0)
MCHC: 32.6 g/dL (ref 31.5–35.7)
MCV: 98 fL — AB (ref 79–97)
MONOCYTES: 6 %
Monocytes Absolute: 0.4 10*3/uL (ref 0.1–0.9)
NEUTROS ABS: 3.2 10*3/uL (ref 1.4–7.0)
NEUTROS PCT: 55 %
PLATELETS: 338 10*3/uL (ref 150–379)
RBC: 4.3 x10E6/uL (ref 3.77–5.28)
RDW: 12.7 % (ref 12.3–15.4)
WBC: 5.8 10*3/uL (ref 3.4–10.8)

## 2015-10-27 LAB — TSH: TSH: 0.873 u[IU]/mL (ref 0.450–4.500)

## 2015-10-31 LAB — IGP, APTIMA HPV, RFX 16/18,45
HPV APTIMA: NEGATIVE
PAP Smear Comment: 0

## 2015-11-03 ENCOUNTER — Telehealth: Payer: Self-pay | Admitting: Family Medicine

## 2015-11-03 NOTE — Telephone Encounter (Signed)
Relayed results to patient. Will repeat pap in 1 year

## 2015-11-03 NOTE — Telephone Encounter (Signed)
Called and [facility] for her to call back. She had abnormal pap (LGSIL- but negative HPV). Nothing to worry about, we just recheck it in 1 year

## 2015-12-16 ENCOUNTER — Telehealth: Payer: Self-pay

## 2015-12-16 ENCOUNTER — Encounter: Payer: Self-pay | Admitting: Obstetrics and Gynecology

## 2015-12-16 ENCOUNTER — Ambulatory Visit: Payer: PRIVATE HEALTH INSURANCE | Admitting: Obstetrics and Gynecology

## 2015-12-16 VITALS — BP 132/74 | HR 71 | Ht 62.0 in | Wt 199.1 lb

## 2015-12-16 DIAGNOSIS — N926 Irregular menstruation, unspecified: Secondary | ICD-10-CM

## 2015-12-16 LAB — POCT URINE PREGNANCY: Preg Test, Ur: NEGATIVE

## 2015-12-16 MED ORDER — MEDROXYPROGESTERONE ACETATE 10 MG PO TABS: 10.0000 mg | ORAL_TABLET | Freq: Every day | ORAL | Status: DC

## 2015-12-16 NOTE — Patient Instructions (Signed)
Intrauterine Device Information An intrauterine device (IUD) is inserted into your uterus to prevent pregnancy. There are two types of IUDs available:   Copper IUD--This type of IUD is wrapped in copper wire and is placed inside the uterus. Copper makes the uterus and fallopian tubes produce a fluid that kills sperm. The copper IUD can stay in place for 10 years.  Hormone IUD--This type of IUD contains the hormone progestin (synthetic progesterone). The hormone thickens the cervical mucus and prevents sperm from entering the uterus. It also thins the uterine lining to prevent implantation of a fertilized egg. The hormone can weaken or kill the sperm that get into the uterus. One type of hormone IUD can stay in place for 5 years, and another type can stay in place for 3 years. Your health care provider will make sure you are a good candidate for a contraceptive IUD. Discuss with your health care provider the possible side effects.  ADVANTAGES OF AN INTRAUTERINE DEVICE  IUDs are highly effective, reversible, long acting, and low maintenance.   There are no estrogen-related side effects.   An IUD can be used when breastfeeding.   IUDs are not associated with weight gain.   The copper IUD works immediately after insertion.   The hormone IUD works right away if inserted within 7 days of your period starting. You will need to use a backup method of birth control for 7 days if the hormone IUD is inserted at any other time in your cycle.  The copper IUD does not interfere with your female hormones.   The hormone IUD can make heavy menstrual periods lighter and decrease cramping.   The hormone IUD can be used for 3 or 5 years.   The copper IUD can be used for 10 years. DISADVANTAGES OF AN INTRAUTERINE DEVICE  The hormone IUD can be associated with irregular bleeding patterns.   The copper IUD can make your menstrual flow heavier and more painful.   You may experience cramping and  vaginal bleeding after insertion.    This information is not intended to replace advice given to you by your health care provider. Make sure you discuss any questions you have with your health care provider.   Document Released: 11/08/2004 Document Revised: 08/07/2013 Document Reviewed: 05/26/2013 Elsevier Interactive Patient Education 2016 Elsevier Inc.  

## 2015-12-16 NOTE — Telephone Encounter (Signed)
Need correct quantity for medroxyprogesterone tab.

## 2015-12-16 NOTE — Telephone Encounter (Signed)
Called pharmacy and lm

## 2015-12-23 ENCOUNTER — Encounter: Payer: Self-pay | Admitting: Obstetrics and Gynecology

## 2015-12-23 ENCOUNTER — Ambulatory Visit (INDEPENDENT_AMBULATORY_CARE_PROVIDER_SITE_OTHER): Payer: PRIVATE HEALTH INSURANCE | Admitting: Obstetrics and Gynecology

## 2015-12-23 VITALS — BP 122/70 | HR 67 | Wt 200.0 lb

## 2015-12-23 DIAGNOSIS — N926 Irregular menstruation, unspecified: Secondary | ICD-10-CM

## 2015-12-23 DIAGNOSIS — Z30430 Encounter for insertion of intrauterine contraceptive device: Secondary | ICD-10-CM

## 2015-12-23 LAB — POCT URINE PREGNANCY: PREG TEST UR: NEGATIVE

## 2015-12-23 NOTE — Progress Notes (Signed)
Patient ID: EBUNOLUWA MOALA, female   DOB: [birth year], 40 y.o.   MRN: [id number] CHANDELL ABBY is a 41 y.o. year old G28P0 African American female who presents for placement of a Paragard IUD.  No LMP recorded (lmp unknown). Patient is not currently having periods (Reason: Other). BP 122/70 mmHg  Pulse 67  Wt 200 lb (90.719 kg)  LMP  (LMP Unknown) Last sexual intercourse was 2 weeks ago, and pregnancy test today was negative  The risks and benefits of the method and placement have been thouroughly reviewed with the patient and all questions were answered.  Specifically the patient is aware of failure rate of 12/998, expulsion of the IUD and of possible perforation.  The patient is aware of irregular bleeding due to the method and understands the incidence of irregular bleeding diminishes with time.  Signed copy of informed consent in chart.   Time out was performed.  A pederson speculum was placed in the vagina.  The cervix was visualized, prepped using Betadine, and grasped with a single tooth tenaculum. The uterus was found to be neutral and it sounded to 7 cm.  Paragard IUD placed per manufacturer's recommendations.   The strings were trimmed to 3 cm.  The patient was given post procedure instructions, including signs and symptoms of infection and to check for the strings after each menses or each month, and refraining from intercourse or anything in the vagina for 3 days.  She was given a Paragrd care card with date placed, and date  to be removed.    Husayn Reim Rockney Ghee, CNM

## 2016-03-07 ENCOUNTER — Telehealth: Payer: Self-pay | Admitting: Obstetrics and Gynecology

## 2016-03-07 NOTE — Telephone Encounter (Signed)
Never seen this as a side effect of IUD. Recommend seeing a dermatologist for evaluation

## 2016-03-07 NOTE — Telephone Encounter (Signed)
Pt states ever since she had IUD placed her skin has been itching all over, thought at first she was getting bit in her sleep then found out no one else in the house was getting bit, she just wants to make sure it isnt her IUD

## 2016-03-07 NOTE — Telephone Encounter (Signed)
HAS SOME PROBLEMS WITH IUD AND WOULD LIKE TO DISCUSS

## 2016-03-08 ENCOUNTER — Telehealth: Payer: Self-pay | Admitting: Obstetrics and Gynecology

## 2016-03-08 NOTE — Telephone Encounter (Signed)
PT CALLED YESTERDAY AND TALKED TO YOU ABOUT BEING ALLERGIC TO THE IUD AND SHE WAS TOLD THAT SHE WOULD GET A PHONE CALL BACK THIS AFTER NOON BUT SHE IS NOT WANTING TO WAIT UNTIL THIS AFTERNOON, SHE WANTS TO GET THE IUD REMOVED BECAUSE SHE STATED SHE IS FREAKING OUT AND WANTED TO COME IN TODAY AND HAVE IT REMOVED, I EXPLAINED TO PT THAT [facility] WILL NOT BE HERE THIS AFTERNOON AND THAT SHE HAS NO OPENINGS TODAY BUT I WOULD SEND THE MESSAGE, SHE IS ITCHING EVERYWHERE, SHE STATED ITS BEEN GOING ON FOR ABOUT 3 WEEKS TO A MONTH. SHE STATES THAT SHE ITCHES AND THEN SCRATCHES AND IT WELPS UP.

## 2016-03-08 NOTE — Telephone Encounter (Signed)
DONE

## 2016-03-08 NOTE — Telephone Encounter (Signed)
See other message

## 2016-03-08 NOTE — Telephone Encounter (Signed)
See if she can come in tomorrow at 845? If not, Dr D has more openings and he can see her for it if she is ok with that.

## 2016-03-08 NOTE — Telephone Encounter (Signed)
Mel what would you like to do??

## 2016-03-09 ENCOUNTER — Encounter: Payer: Self-pay | Admitting: Obstetrics and Gynecology

## 2016-03-09 ENCOUNTER — Ambulatory Visit (INDEPENDENT_AMBULATORY_CARE_PROVIDER_SITE_OTHER): Payer: PRIVATE HEALTH INSURANCE | Admitting: Obstetrics and Gynecology

## 2016-03-09 VITALS — BP 118/81 | HR 68 | Ht 62.0 in | Wt 198.3 lb

## 2016-03-09 DIAGNOSIS — Z30432 Encounter for removal of intrauterine contraceptive device: Secondary | ICD-10-CM

## 2016-03-09 DIAGNOSIS — L299 Pruritus, unspecified: Secondary | ICD-10-CM

## 2016-03-09 MED ORDER — HYDROXYZINE PAMOATE 100 MG PO CAPS: 100.0000 mg | ORAL_CAPSULE | Freq: Three times a day (TID) | ORAL | Status: DC | PRN

## 2016-03-09 NOTE — Progress Notes (Signed)
Bailey Perry is a 40 y.o. year old G109P0 African American female who presents for removal of a Mirena IUD. Her Mirena IUD was placed Jan 2017, and developed bumps on arms that itch constently, now appearing on legs, chest, and back of neck. Feels like it is from allergy to Mirena. Menses have been heavy also, desires removal. Will use condoms for the time being.   No LMP recorded. Patient is not currently having periods (Reason: Other). BP 118/81 mmHg  Pulse 68  Ht 5\' 2"  (1.575 m)  Wt 198 lb 4.8 oz (89.948 kg)  BMI 36.26 kg/m2 Small scatted bumps noted on arms and legs. Time out was performed.  A Dubree speculum was placed in the vagina.  The cervix was visualized, and the strings were visible. They were grasped and the Mirena was easily removed intact without complications.   F/U prn, would recommend dermatology evaluation if itching persist. Rx for visteril 100mg  qhs sent in.  Melody Rockney Ghee, CNM

## 2016-03-28 ENCOUNTER — Encounter: Payer: Self-pay | Admitting: Family Medicine

## 2016-03-28 ENCOUNTER — Ambulatory Visit (INDEPENDENT_AMBULATORY_CARE_PROVIDER_SITE_OTHER): Payer: PRIVATE HEALTH INSURANCE | Admitting: Unknown Physician Specialty

## 2016-03-28 VITALS — BP 124/84 | HR 85 | Temp 97.4°F | Ht 62.5 in | Wt 198.0 lb

## 2016-03-28 DIAGNOSIS — L509 Urticaria, unspecified: Secondary | ICD-10-CM

## 2016-03-28 MED ORDER — BETAMETHASONE DIPROPIONATE AUG 0.05 % EX CREA: TOPICAL_CREAM | Freq: Two times a day (BID) | CUTANEOUS | Status: DC

## 2016-03-28 MED ORDER — PREDNISONE 10 MG PO TABS: ORAL_TABLET | ORAL | Status: DC

## 2016-03-28 NOTE — Progress Notes (Signed)
   BP 124/84 mmHg  Pulse 85  Temp(Src) 97.4 F (36.3 C)  Ht 5' 2.5" (1.588 m)  Wt 198 lb (89.812 kg)  BMI 35.62 kg/m2  SpO2 99%  LMP 03/08/2016 (Approximate)   Subjective:    Patient ID: Bailey Perry, female    DOB: 02/11/1976, 40 y.o.   MRN: [id number]  HPI: Bailey Perry is a 40 y.o. female  Chief Complaint  Patient presents with  . Allergic Reaction    She was having itching and rashes and thought it might have been from her IUD. She had it removed and she's still having the issues. She was given Hydroxyzine but she doesn't want to take it because they told her it'd make her sleepy.   Hives States her hives started 3 months ago about 1 month following IUD insertion (copper wire).  The IUD was removed on 3/22.  Continuing to have a rash and hives.  Pt they started as a bump on forearms and expanded all over her body.  States she gets 1-2 every day.  They will come, be there 2-3 days and leave a bruise.  Denies any new stress and states "nothing is different."  No new lotions or soaps.  She doesn't remember ever being bit by a tick.    Relevant past medical, surgical, family and social history reviewed and updated as indicated. Interim medical history since our last visit reviewed. Allergies and medications reviewed and updated.  Review of Systems  Per HPI unless specifically indicated above     Objective:    BP 124/84 mmHg  Pulse 85  Temp(Src) 97.4 F (36.3 C)  Ht 5' 2.5" (1.588 m)  Wt 198 lb (89.812 kg)  BMI 35.62 kg/m2  SpO2 99%  LMP 03/08/2016 (Approximate)  Wt Readings from Last 3 Encounters:  03/28/16 198 lb (89.812 kg)  03/09/16 198 lb 4.8 oz (89.948 kg)  12/23/15 200 lb (90.719 kg)    Physical Exam  Constitutional: She is oriented to person, place, and time. She appears well-developed and well-nourished. No distress.  HENT:  Head: Normocephalic and atraumatic.  Eyes: Conjunctivae and lids are normal. Right eye exhibits no discharge. Left eye  exhibits no discharge. No scleral icterus.  Cardiovascular: Normal rate.   Pulmonary/Chest: Effort normal.  Abdominal: Normal appearance. There is no splenomegaly or hepatomegaly.  Musculoskeletal: Normal range of motion.  Neurological: She is alert and oriented to person, place, and time.  Skin: Skin is intact. No rash noted. No pallor.  Psychiatric: She has a normal mood and affect. Her behavior is normal. Judgment and thought content normal.    Results for orders placed or performed in visit on 12/23/15  POCT urine pregnancy  Result Value Ref Range   Preg Test, Ur Negative Negative      Assessment & Plan:   Problem List Items Addressed This Visit    None    Visit Diagnoses    Urticaria    -  Primary    Unknown cause.  Rx for pred taper.  Start daily Zyrtec.  Recheck in 2 weeks.  Consider alpha gal        Follow up plan: Return in about 2 weeks (around 04/11/2016).

## 2016-03-28 NOTE — Patient Instructions (Signed)
Take steroids.  6 pills for 3 days and then 4 pills for 3 days, and then 2 pills for 3 days, then 1 pill for 3 days, then 1/2 for 2 days.

## 2016-04-12 ENCOUNTER — Ambulatory Visit (INDEPENDENT_AMBULATORY_CARE_PROVIDER_SITE_OTHER): Payer: PRIVATE HEALTH INSURANCE | Admitting: Family Medicine

## 2016-04-12 ENCOUNTER — Encounter: Payer: Self-pay | Admitting: Family Medicine

## 2016-04-12 VITALS — BP 113/70 | HR 75 | Ht 62.5 in | Wt 205.2 lb

## 2016-04-12 DIAGNOSIS — T148 Other injury of unspecified body region: Secondary | ICD-10-CM | POA: Diagnosis not present

## 2016-04-12 DIAGNOSIS — W57XXXA Bitten or stung by nonvenomous insect and other nonvenomous arthropods, initial encounter: Secondary | ICD-10-CM

## 2016-04-12 NOTE — Progress Notes (Signed)
BP 113/70 mmHg  Pulse 75  Ht 5' 2.5" (1.588 m)  Wt 205 lb 3.2 oz (93.078 kg)  BMI 36.91 kg/m2  SpO2 99%  LMP 03/08/2016 (Approximate)   Subjective:    Patient ID: Bailey Perry, female    DOB: Jul 22, 1976, 40 y.o.   MRN: [id number]  HPI: Bailey Perry is a 40 y.o. female  Chief Complaint  Patient presents with  . Rash   Feeling much better. After she was on the prednisone, she found out she had bed bugs. She treated them and is not itching any more. She is feeling better with no other concerns or complaints at this time  Relevant past medical, surgical, family and social history reviewed and updated as indicated. Interim medical history since our last visit reviewed. Allergies and medications reviewed and updated.  Review of Systems  Constitutional: Negative.   Respiratory: Negative.   Cardiovascular: Negative.   Skin: Negative.   Psychiatric/Behavioral: Negative.     Per HPI unless specifically indicated above     Objective:    BP 113/70 mmHg  Pulse 75  Ht 5' 2.5" (1.588 m)  Wt 205 lb 3.2 oz (93.078 kg)  BMI 36.91 kg/m2  SpO2 99%  LMP 03/08/2016 (Approximate)  Wt Readings from Last 3 Encounters:  04/12/16 205 lb 3.2 oz (93.078 kg)  03/28/16 198 lb (89.812 kg)  03/09/16 198 lb 4.8 oz (89.948 kg)    Physical Exam  Constitutional: She is oriented to person, place, and time. She appears well-developed and well-nourished. No distress.  HENT:  Head: Normocephalic and atraumatic.  Right Ear: Hearing normal.  Left Ear: Hearing normal.  Nose: Nose normal.  Eyes: Conjunctivae and lids are normal. Right eye exhibits no discharge. Left eye exhibits no discharge. No scleral icterus.  Cardiovascular: Normal rate, regular rhythm, normal heart sounds and intact distal pulses.  Exam reveals no gallop and no friction rub.   No murmur heard. Pulmonary/Chest: Effort normal and breath sounds normal. No respiratory distress. She has no wheezes. She has no rales. She  exhibits no tenderness.  Musculoskeletal: Normal range of motion.  Neurological: She is alert and oriented to person, place, and time.  Skin: Skin is warm, dry and intact. No rash noted. No erythema. No pallor.  Psychiatric: She has a normal mood and affect. Her speech is normal and behavior is normal. Judgment and thought content normal. Cognition and memory are normal.  Nursing note and vitals reviewed.   Results for orders placed or performed in visit on 12/23/15  POCT urine pregnancy  Result Value Ref Range   Preg Test, Ur Negative Negative      Assessment & Plan:   Problem List Items Addressed This Visit    None    Visit Diagnoses    Bug bites    -  Primary    Resolved. Call with any concerns.         Follow up plan: Return if symptoms worsen or fail to improve.

## 2016-05-09 ENCOUNTER — Encounter: Payer: Self-pay | Admitting: Obstetrics and Gynecology

## 2016-05-09 ENCOUNTER — Ambulatory Visit (INDEPENDENT_AMBULATORY_CARE_PROVIDER_SITE_OTHER): Payer: PRIVATE HEALTH INSURANCE | Admitting: Obstetrics and Gynecology

## 2016-05-09 VITALS — BP 118/84 | HR 85 | Wt 204.1 lb

## 2016-05-09 DIAGNOSIS — Z975 Presence of (intrauterine) contraceptive device: Secondary | ICD-10-CM

## 2016-05-09 LAB — POCT URINE PREGNANCY: PREG TEST UR: NEGATIVE

## 2016-05-09 MED ORDER — LEVONORGESTREL 20 MCG/24HR IU IUD: 1.0000 | INTRAUTERINE_SYSTEM | Freq: Once | INTRAUTERINE | Status: DC

## 2016-05-09 NOTE — Progress Notes (Signed)
Bailey Perry is a 40 y.o. year old G12P0 African American female who presents for placement of a Mirena IUD.  Patient's last menstrual period was 04/14/2016. BP 118/84 mmHg  Pulse 85  Wt 204 lb 1.6 oz (92.579 kg)  LMP 04/14/2016 Last sexual intercourse was over 3 months ago, and pregnancy test today was negative  The risks and benefits of the method and placement have been thouroughly reviewed with the patient and all questions were answered.  Specifically the patient is aware of failure rate of 12/998, expulsion of the IUD and of possible perforation.  The patient is aware of irregular bleeding due to the method and understands the incidence of irregular bleeding diminishes with time.  Signed copy of informed consent in chart.   Time out was performed.  A pederson speculum was placed in the vagina.  The cervix was visualized, prepped using Betadine, and grasped with a single tooth tenaculum. The uterus was found to be anteroflexed and it sounded to 8 cm.  Mirena IUD placed per manufacturer's recommendations.   The strings were trimmed to 3 cm.  The patient was given post procedure instructions, including signs and symptoms of infection and to check for the strings after each menses or each month, and refraining from intercourse or anything in the vagina for 3 days.  She was given a Mirena care card with date Mirena placed, and date Mirena to be removed.    Johnnye Sandford Rockney Ghee, CNM

## 2016-09-07 ENCOUNTER — Other Ambulatory Visit: Payer: Self-pay | Admitting: Student

## 2016-09-07 DIAGNOSIS — M25562 Pain in left knee: Secondary | ICD-10-CM

## 2016-09-16 ENCOUNTER — Ambulatory Visit
Admission: RE | Admit: 2016-09-16 | Discharge: 2016-09-16 | Disposition: A | Discharge status: Home / Self Care | Payer: PRIVATE HEALTH INSURANCE | Source: Ambulatory Visit | Attending: Student | Admitting: Student

## 2016-09-16 DIAGNOSIS — M25562 Pain in left knee: Secondary | ICD-10-CM

## 2016-09-16 DIAGNOSIS — M25862 Other specified joint disorders, left knee: Secondary | ICD-10-CM | POA: Diagnosis not present

## 2016-10-11 ENCOUNTER — Ambulatory Visit: Payer: No Typology Code available for payment source | Attending: Student | Admitting: Physical Therapy

## 2016-10-11 ENCOUNTER — Encounter: Payer: Self-pay | Admitting: Physical Therapy

## 2016-10-11 DIAGNOSIS — R262 Difficulty in walking, not elsewhere classified: Secondary | ICD-10-CM

## 2016-10-11 DIAGNOSIS — M6281 Muscle weakness (generalized): Secondary | ICD-10-CM

## 2016-10-11 DIAGNOSIS — M25562 Pain in left knee: Secondary | ICD-10-CM

## 2016-10-12 NOTE — Therapy (Signed)
[facility] [address], [place], [address] Phone: [phone]   Fax:  [phone]  Physical Therapy Evaluation  Patient Details  Name: Bailey Perry MRN: [id number] Date of Birth: Jun 15, 1976 Referring Provider: Lattie Corns, [place]  Encounter Date: 10/11/2016      PT End of Session - 10/11/16 1730    Visit Number 1   Number of Visits 12   Date for PT Re-Evaluation 11/22/16   PT Start Time 1625   PT Stop Time 1725   PT Time Calculation (min) 60 min   Activity Tolerance Patient tolerated treatment well;Patient limited by pain   Behavior During Therapy [facility] for tasks assessed/performed      Past Medical History:  Diagnosis Date  . Abnormal Pap smear of cervix     Past Surgical History:  Procedure Laterality Date  . APPENDECTOMY    . CESAREAN SECTION     X 2    There were no vitals filed for this visit.       Subjective Assessment - 10/11/16 1639    Subjective Patient reports she has had knee pain since 07/01/2016 and in the morning her knee is fine and as the day progresses her left knee pain worsens with increased tightness.    Pertinent History 07/01/2016 At work she was squatting down for a while like 2-3 min. and after returning to standing she noticed increased pain in front of knee across entire knee about 30 min later and she had difficulty with walking; she could not walk and had to be assisted off of the work floor.     Limitations Sitting;Standing;Walking;House hold activities   Patient Stated Goals Patient would like to decrease knee pain and be able to work, work second job and exercise   Currently in Pain? 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  No   Is the patient reluctant to leave their home because of a fear of falling?  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, walking 8 hours/day, 5 days/week   Leisure [employer], family,      Cognition   Overall Cognitive Status Within Functional Limits for tasks assessed     Objective: Posture: standing: decreased weight bearing left LE with left knee with knee in slight flexion AROM: right knee 0-130, left knee 0-105 with pain limiting further motion Strength: grossly tested  Hip flexion right 4/5, left 4-/5 Hip abduction right 4/5, left 4-/5 Hip ER right 4/5, left 4-/5 Knee extension right 5/5, left 4/5 Knee flexion right 4/5 left 4-/5 with pain reported in left knee medial aspect Ankle DF right 5/5, left 4/5 with reported increased knee pain  (note: shaking with testing for ankle DF, knee flexion /extension and hip flexion left>right) Palpation: decreased soft tissue elasticity distal left quadriceps, + tenderness posterior aspect left knee and anterior aspect along bilateral patellar tendon  Gait: ambulating independently without AD with decreased cadence and guarded posture Outcome measure: LEFS 30/80 (80 = no self perceived disability)  Treatment:  Therapeutic exercises: Therapeutic exercise: patient performed exercises with verbal, tactile cues  demonstration of therapist: Sitting: Hip adduction with glute sets x 10 Knee flexion with ball underfoot SLR and quad sets in supine/sitting x 10 each  Patellar distraction with patellar mobilizer performed to left knee 2 reps prior to exercises  Patient response to treatment: patient demonstrated improved technique with exercises with moderate VC and repeated demosntration for correct alignment and technique. Patient able to perform knee flexion with less discomfort and improved quad setting following patella distraction/mobilization        PT Education - 10/11/16 1700    Education provided Yes   Education Details HEP: ROM of knee with ball roll outs, quad sets, SLR with knee in slight flexion   Person(s) Educated Patient   Methods Explanation;Demonstration;Verbal cues;Handout   Comprehension Verbalized understanding;Returned demonstration;Verbal cues required             PT Long Term Goals - 10/11/16 1730      PT LONG TERM GOAL #1   Title Patient will improve LEFS score to 60/80 demonstrating improved sitting, standing, walking, stair climbing with decreased pain by 11/22/2016   Baseline LEFS 30/80   Status New     PT LONG TERM GOAL #2   Title Patient will have full pain free AROM left knee by 11/22/2016 to allow improved function with sitting, stair climbing   Baseline limited AROM left knee 0-105 degrees with pain limiting further motion   Status New     PT  LONG TERM GOAL #3   Title Patient will be independent with home program without cuing to allow self management following discharge from physical therapy 11/22/2016   Baseline limited knowledge of pain control, progression of exercises without maximal cuing   Status New               Plan - 10/11/16 1730    Clinical Impression Statement Patient is a 40 year old female who presents with pain in left knee that began 07/01/2016 after sqatting activity. She is limited in abiltiy to stand and walk without difficulty and her stiffness and pain in left knee worsen as the day progresses. Her pain is improving since initial pain began. She has LEFS score of 30/80 with difficulty with squatting,  stairs, sitting, bending and walking, standing. She has limited knowledge of appropriate pain control strategies and progression and will therefore benefit from physical therapy intervention to return to prior level of function.    Rehab Potential Good   Clinical Impairments Affecting Rehab Potential (+)acute condition, age, motivated, improving (-) significant limited motion   PT Frequency 2x / week   PT Duration 6 weeks   PT Treatment/Interventions Iontophoresis 4mg /ml Dexamethasone;Electrical Stimulation;Cryotherapy;Moist Heat;Ultrasound;Patient/family education;Neuromuscular re-education;Therapeutic exercise;Manual techniques;Dry needling   PT Next Visit Plan pain control, modalities as indicated, manula therapy STM, therapeutic exercises   PT Home Exercise Plan HEP: quad sets, ROM exercises left knee, SLR, pain control   Consulted and Agree with Plan of Care Patient      Patient will benefit from skilled therapeutic intervention in order to improve the following deficits and impairments:  Decreased strength, Impaired flexibility, Pain, Decreased activity tolerance, Impaired perceived functional ability, Decreased range of motion, Difficulty walking, Increased muscle spasms  Visit Diagnosis: Acute pain  of left knee - Plan: PT plan of care cert/re-cert  Muscle weakness (generalized) - Plan: PT plan of care cert/re-cert  Difficulty in walking, not elsewhere classified - Plan: PT plan of care cert/re-cert     Problem List Patient Active Problem List   Diagnosis Date Noted  . Migraine with aura and without status migrainosus, not intractable 10/26/2015    Aldona Lento, PT 10/12/2016, 9:32 PM  [facility] [address] [place], [address] Phone: [phone]   Fax:  [phone]  Name: LEOMIA KINSLOW MRN: [id number] Date of Birth: 02/20/1976

## 2016-10-18 ENCOUNTER — Encounter: Payer: Self-pay | Admitting: Physical Therapy

## 2016-10-18 ENCOUNTER — Ambulatory Visit: Payer: No Typology Code available for payment source | Admitting: Physical Therapy

## 2016-10-18 DIAGNOSIS — M25562 Pain in left knee: Secondary | ICD-10-CM

## 2016-10-18 DIAGNOSIS — R262 Difficulty in walking, not elsewhere classified: Secondary | ICD-10-CM

## 2016-10-18 DIAGNOSIS — M6281 Muscle weakness (generalized): Secondary | ICD-10-CM

## 2016-10-18 NOTE — Therapy (Signed)
[facility] [address], [place], [address] Phone: [phone]   Fax:  [phone]  Physical Therapy Treatment  Patient Details  Name: Bailey Perry MRN: [id number] Date of Birth: 1976/02/11 Referring Provider: Lattie Corns, [place]  Encounter Date: 10/18/2016      PT End of Session - 10/18/16 1620    Visit Number 2   Number of Visits 12   Date for PT Re-Evaluation 11/22/16   PT Start Time [id number]   PT Stop Time 1618   PT Time Calculation (min) 45 min   Activity Tolerance Patient limited by pain   Behavior During Therapy [facility] for tasks assessed/performed      Past Medical History:  Diagnosis Date  . Abnormal Pap smear of cervix     Past Surgical History:  Procedure Laterality Date  . APPENDECTOMY    . CESAREAN SECTION     X 2    There were no vitals filed for this visit.      Subjective Assessment - 10/18/16 1534    Subjective Patient reports her left knee is swollen and sore today and may have twisted her knee when taking her pants off last evening. She reports she has difficulty walking as the day progresses and her knee is tight and she has difficulty with bending it all day.    Limitations Sitting;Standing;Walking;House hold activities   Patient Stated Goals Patient would like to decrease knee pain and be able to work, work second job and exercise   Currently in Pain? Yes   Pain Score 3   with walking   Pain Location Knee   Pain Orientation Left   Pain Descriptors / Indicators Tightness   Pain Type Acute pain   Pain Onset More than a month ago   Pain Frequency Intermittent      Objective: Gait: antalgic pattern with decreased weight bearing right  Palpation: point tender anterior aspect left knee more medial at end of session/?Pes Anserine bursa  Treatment: Modalities: Electrical stimulation: high volt applied to anterior aspect left knee medial/lateral aspects and russian  stimulation applied to VMO/quadriceps muscle with patient performing quad setting each cycle 10/10 cycle, intensity to tolerance with goal of neuromuscular re education/pain control  Therapeutic exercise: Quad setting x 5 reps with verbal and tactile cuing to perform with partial contraction  PF mobilization with patella mobilizer x 3 reps for distraction and medial/lateral glides Sitting knee flexion 0-90 degrees pre exercise; AAROM performed with assistance of therapist to improve ROM to 100 with pain limiting further motion  NuSTep performed with guidance and monitoring for pain and ROM: 3 minutes level #3 workload  Patient response to treatment: Patient demonstrated improved technique with exercises with assistance of therapist and VC. Patient with decreased pain to mild with weight bearing/walking following high volt estim. And improved quad control following [place] stim.  Patient tolerated Nustep well with mild increased left knee pain, medially       PT Education - 10/18/16 1410    Education provided Yes   Education Details HEP: re assessed home program for SLR, quad setting and ROM exercises   Person(s) Educated Patient   Methods Explanation;Demonstration;Tactile cues   Comprehension Verbalized understanding;Returned demonstration;Verbal cues required             PT Long Term Goals - 10/11/16 1730      PT LONG TERM GOAL #1   Title Patient will improve LEFS score to 60/80 demonstrating  improved sitting, standing, walking, stair climbing with decreased pain by 11/22/2016   Baseline LEFS 30/80   Status New     PT LONG TERM GOAL #2   Title Patient will have full pain free AROM left knee by 11/22/2016 to allow improved function with sitting, stair climbing   Baseline limited AROM left knee 0-105 degrees with pain limiting further motion   Status New     PT LONG TERM GOAL #3   Title Patient will be independent with home program without cuing to allow self management  following discharge from physical therapy 11/22/2016   Baseline limited knowledge of pain control, progression of exercises without maximal cuing   Status New               Plan - 10/18/16 1620    Clinical Impression Statement Patient limited by pain for exercises. Improved abiltiy to perform execises with decreased tenderness and pain medial aspect of left knee following high volt and [place] stimulation treatement. If she continues with pain she may benefit from iontophoresis to pes anserine bursa.    Rehab Potential Good   PT Frequency 2x / week   PT Duration 6 weeks   PT Treatment/Interventions Iontophoresis 4mg /ml Dexamethasone;Electrical Stimulation;Cryotherapy;Moist Heat;Ultrasound;Patient/family education;Neuromuscular re-education;Therapeutic exercise;Manual techniques;Dry needling   PT Next Visit Plan pain control, modalities as indicated, manula therapy STM, therapeutic exercises   PT Home Exercise Plan HEP: quad sets, ROM exercises left knee, SLR, pain control      Patient will benefit from skilled therapeutic intervention in order to improve the following deficits and impairments:  Decreased strength, Impaired flexibility, Pain, Decreased activity tolerance, Impaired perceived functional ability, Decreased range of motion, Difficulty walking, Increased muscle spasms  Visit Diagnosis: Acute pain of left knee  Muscle weakness (generalized)  Difficulty in walking, not elsewhere classified     Problem List Patient Active Problem List   Diagnosis Date Noted  . Migraine with aura and without status migrainosus, not intractable 10/26/2015    [name] PT 10/18/2016, 9:30 PM  [facility] [address] [place], [address] Phone: [phone]   Fax:  [phone]  Name: Bailey Perry MRN: [id number] Date of Birth: [birth year]

## 2016-10-20 ENCOUNTER — Ambulatory Visit: Payer: No Typology Code available for payment source | Attending: Student | Admitting: Physical Therapy

## 2016-10-20 ENCOUNTER — Encounter: Payer: Self-pay | Admitting: Physical Therapy

## 2016-10-20 DIAGNOSIS — M25562 Pain in left knee: Secondary | ICD-10-CM | POA: Diagnosis present

## 2016-10-20 DIAGNOSIS — R262 Difficulty in walking, not elsewhere classified: Secondary | ICD-10-CM | POA: Diagnosis present

## 2016-10-20 DIAGNOSIS — M6281 Muscle weakness (generalized): Secondary | ICD-10-CM

## 2016-10-20 NOTE — Therapy (Signed)
[facility] [address], [place], [address] Phone: [phone]   Fax:  [phone]  Physical Therapy Treatment  Patient Details  Name: Bailey Perry MRN: [id number] Date of Birth: 1976/04/12 Referring Provider: Lattie Corns, [place]  Encounter Date: 10/20/2016      PT End of Session - 10/20/16 1649    Visit Number 3   Number of Visits 12   Date for PT Re-Evaluation 11/22/16   PT Start Time 1535   PT Stop Time 1625   PT Time Calculation (min) 50 min   Activity Tolerance Patient limited by pain   Behavior During Therapy [facility] for tasks assessed/performed      Past Medical History:  Diagnosis Date  . Abnormal Pap smear of cervix     Past Surgical History:  Procedure Laterality Date  . APPENDECTOMY    . CESAREAN SECTION     X 2    There were no vitals filed for this visit.      Subjective Assessment - 10/20/16 1536    Subjective Patient reports having less pain and swelling in her knee today and today overall has been good. Her knee still feels tight and is painful anteromedial aspect with walking and moving.    Limitations Sitting;Standing;Walking;House hold activities   Patient Stated Goals Patient would like to decrease knee pain and be able to work, work second job and exercise   Currently in Pain? Yes   Pain Score 2   with walking   Pain Location Knee   Pain Orientation Left   Pain Descriptors / Indicators Tightness   Pain Type Acute pain   Pain Onset More than a month ago   Pain Frequency Intermittent      Objective: Gait: improved gait pattern with increasing weight bearing onto painful LE AROM: knee flexion left 0-110  Strength: left knee flexion 3+/5 at end range, 4-/5; 0-90 degrees, left knee extension 4-/5 with decreased quad control noted with quad setting Palpation: point tender anterior aspect left knee more medial at end of session/?Pes Anserine  bursa  Treatment: Modalities: Electrical stimulation: high volt applied to anterior aspect left knee medial/lateral aspects and [place] stimulation applied to VMO/quadriceps muscle with patient performing quad setting each cycle 10/10 cycle, intensity to tolerance with goal of neuromuscular re education/pain control  Therapeutic exercise: Quad setting x 5 reps with verbal and tactile cuing to perform with partial contraction  PF mobilization with patella mobilizer x 3 reps for distraction and medial/lateral glides Sitting knee flexion AAROM with therapist assist x 10 reps followed by multi angle isometrics x 3 sets with hold at each angle x 5 seconds AAROM left knee sitting ball roll outs x 2 min.   NuSTep performed with guidance and monitoring for pain and ROM: 5 minutes level #3 workload  Patient response to treatment: Patient demonstrated improved technique with exercises with assistance of therapist and moderate VC for correct performancet. Patient with increased pain with exercises to 3/10  Improved motor control with repetition and cuing, following estim.; patient demonstrated mild increased soreness in left knee following nu Step and demonstrated antagic gait pattern following session         PT Education - 10/20/16 1649    Education provided Yes   Education Details HEP: work on isometric exercises for hamstring left knee flexion   Person(s) Educated Patient   Methods Explanation;Demonstration;Verbal cues   Comprehension Verbalized understanding;Returned demonstration;Verbal cues required  PT Long Term Goals - 10/11/16 1730      PT LONG TERM GOAL #1   Title Patient will improve LEFS score to 60/80 demonstrating improved sitting, standing, walking, stair climbing with decreased pain by 11/22/2016   Baseline LEFS 30/80   Status New     PT LONG TERM GOAL #2   Title Patient will have full pain free AROM left knee by 11/22/2016 to allow improved function with  sitting, stair climbing   Baseline limited AROM left knee 0-105 degrees with pain limiting further motion   Status New     PT LONG TERM GOAL #3   Title Patient will be independent with home program without cuing to allow self management following discharge from physical therapy 11/22/2016   Baseline limited knowledge of pain control, progression of exercises without maximal cuing   Status New               Plan - 10/20/16 1630    Clinical Impression Statement Patient demonstrated decreased pain and improved abiltiy to perform exercises with less difficulty with assistance, guidance and repetition. She continues with decreased ROM and strength left knee, increased pain with movement and decreased ability to walk due to left knee pain. She should continue to improve with continued physical therapy intervention.    Rehab Potential Good   PT Frequency 2x / week   PT Duration 6 weeks   PT Treatment/Interventions Iontophoresis 4mg /ml Dexamethasone;Electrical Stimulation;Cryotherapy;Moist Heat;Ultrasound;Patient/family education;Neuromuscular re-education;Therapeutic exercise;Manual techniques;Dry needling   PT Next Visit Plan pain control, modalities as indicated, manula therapy STM, therapeutic exercises   PT Home Exercise Plan HEP: quad sets, ROM exercises left knee, SLR, pain control      Patient will benefit from skilled therapeutic intervention in order to improve the following deficits and impairments:  Decreased strength, Impaired flexibility, Pain, Decreased activity tolerance, Impaired perceived functional ability, Decreased range of motion, Difficulty walking, Increased muscle spasms  Visit Diagnosis: Acute pain of left knee  Muscle weakness (generalized)  Difficulty in walking, not elsewhere classified     Problem List Patient Active Problem List   Diagnosis Date Noted  . Migraine with aura and without status migrainosus, not intractable 10/26/2015    Bailey Perry  PT 10/21/2016, 7:18 PM  [place] [facility] [address] [place], [address] Phone: [phone]   Fax:  [phone]  Name: Bailey Perry MRN: [id number] Date of Birth: 11-20-1976

## 2016-10-24 ENCOUNTER — Encounter: Payer: Self-pay | Admitting: Physical Therapy

## 2016-10-24 ENCOUNTER — Ambulatory Visit: Payer: No Typology Code available for payment source | Admitting: Physical Therapy

## 2016-10-24 DIAGNOSIS — M25562 Pain in left knee: Secondary | ICD-10-CM

## 2016-10-24 DIAGNOSIS — R262 Difficulty in walking, not elsewhere classified: Secondary | ICD-10-CM

## 2016-10-24 DIAGNOSIS — M6281 Muscle weakness (generalized): Secondary | ICD-10-CM

## 2016-10-25 NOTE — Therapy (Signed)
[place] [facility] [address] [place], [address] Phone: [phone]   Fax:  [phone]  Physical Therapy Treatment  Patient Details  Name: Bailey Perry MRN: [id number] Date of Birth: 05-Dec-1976 Referring Provider: Lattie Corns, [place]  Encounter Date: 10/24/2016      PT End of Session - 10/24/16 1705    Visit Number 4   Number of Visits 12   Date for PT Re-Evaluation 11/22/16   Authorization Type 4   Authorization Time Period 5   PT Start Time 1624   PT Stop Time 1705   PT Time Calculation (min) 41 min   Activity Tolerance Patient limited by pain   Behavior During Therapy [facility] for tasks assessed/performed      Past Medical History:  Diagnosis Date  . Abnormal Pap smear of cervix     Past Surgical History:  Procedure Laterality Date  . APPENDECTOMY    . CESAREAN SECTION     X 2    There were no vitals filed for this visit.      Subjective Assessment - 10/24/16 1640    Subjective Patient reports she is much improved since beginning therapy treatment. She now has intermittent pain in left knee with walking, standing and is able to bend with less pain. She still has increase tightness/paiin in left knee with walking, bending, squatting and is not able to do all she would like without pain, difficulty. She feels therapy is helping and would like to continue.    Limitations Sitting;Standing;Walking;House hold activities   Patient Stated Goals Patient would like to decrease knee pain and be able to work, work second job and exercise   Currently in Pain? Yes   Pain Score 2   with walking    Pain Location Knee   Pain Orientation Left   Pain Descriptors / Indicators Tightness;Other (Comment)  pinching with walking   Pain Type Acute pain   Pain Onset More than a month ago   Pain Frequency Intermittent   Aggravating Factors  standing, sitting, walking   Pain Relieving Factors rest, physical  therapy   Effect of Pain on Daily Activities decreased abiltiy to perform standing, squatting, walking, bending activties       Objective: Gait: improved gait pattern with increasing weight bearing   LLE AROM: knee flexion left 0-110 with tightness, mild pain end range Strength: left knee flexion 4-/5 end range (110 degrees), 4/5 with less knee flexion (demonstrates improvement: was 3+/5), left knee extension 4-/5 with improving quad control noted with quad setting Palpation: Point tender along medial aspect left knee anterior/ ?Pes Anserine bursa  Treatment: Modalities: Electrical stimulation: high volt applied to anterior aspect left knee medial/lateral aspects and [place] stimulation applied to VMO/quadriceps muscle with patient performing quad setting each cycle 10/10 cycle, intensity to tolerance with goal of neuromuscular re education/pain control  Therapeutic exercise: Quad setting x 5 reps with verbal and tactile cuing to perform with partial contraction  PF mobilization with patella mobilizer x 3 reps for distraction and medial/lateral glides Sitting knee flexion AAROM with therapist assist x 10 reps followed by multi angle isometrics x 3 sets with hold at each angle x 5 seconds Multi angle knee extension isometrics 90, 60 degrees x 3 sets ( increased pain with increased extension)   NuSTep performed with guidance and monitoring for pain and ROM: 7 minutes level #3 workload (slow, guarded motion)  Patient response to treatment: Patient demonstrated improved  technique with exercises with moderate VC for correct alignment and performance with isometric exercises and NuStep. Patient with mild pain with exercises for ROM left knee  Improved motor control with repetition and cuing, following estim.Patient demonstrated mild increased soreness with pinching in left knee with standing/walking following Nustep exercise with mild limp at end of session      PT Education - 10/24/16 0714     Education provided Yes   Education Details HEP: continues with quad sets, SLR with knee in slight flexion to avoid knee pain, hamstring setting   Person(s) Educated Patient   Methods Explanation;Demonstration;Verbal cues   Comprehension Verbalized understanding;Returned demonstration;Verbal cues required             PT Long Term Goals - 10/24/16 1710      PT LONG TERM GOAL #1   Title Patient will improve LEFS score to 60/80 demonstrating improved sitting, standing, walking, stair climbing with decreased pain by 11/22/2016   Baseline LEFS 30/80; current 10/24/16 = 16/80 (severe self perceived limitations)   Status On-going     PT LONG TERM GOAL #2   Title Patient will have full pain free AROM left knee by 11/22/2016 to allow improved function with sitting, stair climbing   Baseline limited AROM left knee 0-105 degrees with pain limiting further motion; current 10/24/16 0-110 with mild discomfort/tight   Status On-going     PT LONG TERM GOAL #3   Title Patient will be independent with home program without cuing to allow self management following discharge from physical therapy 11/22/2016   Baseline limited knowledge of pain control, progression of exercises without maximal cuing   Status On-going               Plan - 10/24/16 1710    Clinical Impression Statement Patient is progressing steadily with physical therapy interventions of electrical stimulation, manual therapy and progressive therapeutic exercise. She continues with functional limitations with abiltiy to squat, walk or stand without increased left knee pain. She continues with limited left knee flexion 0-110 (initially was 0-105 with pain). Her LEFS scores for lower extremity function remains significant for severe self perceived disability with 16/80 (80 = no self perceived disabiltiy). Her pain level has significantly improved and is currently averaging 2/10 with walking (was 3/10, ranging up to 8/10 initially). She  has limited knowledge of appropriate progression of exercises and pain control stratiegies and will benefit from additional physical therapy intervention, including iontophoresis, to achieve goals.    Rehab Potential Good   Clinical Impairments Affecting Rehab Potential (+)acute condition, age, motivated, improving (-) significant limited motion   PT Frequency 2x / week   PT Duration 6 weeks   PT Treatment/Interventions Iontophoresis 4mg /ml Dexamethasone;Electrical Stimulation;Cryotherapy;Moist Heat;Ultrasound;Patient/family education;Neuromuscular re-education;Therapeutic exercise;Manual techniques;Dry needling   PT Next Visit Plan pain control, modalities as indicated, manual therapy STM, therapeutic exercises, iontophoresis    PT Home Exercise Plan HEP: quad sets, ROM exercises left knee, SLR, isometric hamstring exercise, pain control      Patient will benefit from skilled therapeutic intervention in order to improve the following deficits and impairments:  Decreased strength, Impaired flexibility, Pain, Decreased activity tolerance, Impaired perceived functional ability, Decreased range of motion, Difficulty walking, Increased muscle spasms  Visit Diagnosis: Acute pain of left knee  Muscle weakness (generalized)  Difficulty in walking, not elsewhere classified     Problem List Patient Active Problem List   Diagnosis Date Noted  . Migraine with aura and without status migrainosus, not intractable 10/26/2015  [name] PT 10/25/2016, 7:29 AM  [facility] [address] [place], [address] Phone: [phone]   Fax:  [phone]  Name: Bailey Perry MRN: [id number] Date of Birth: [birth year]

## 2016-10-27 ENCOUNTER — Encounter: Payer: Self-pay | Admitting: Physical Therapy

## 2016-10-27 ENCOUNTER — Ambulatory Visit: Payer: No Typology Code available for payment source | Admitting: Physical Therapy

## 2016-10-27 DIAGNOSIS — M6281 Muscle weakness (generalized): Secondary | ICD-10-CM

## 2016-10-27 DIAGNOSIS — M25562 Pain in left knee: Secondary | ICD-10-CM

## 2016-10-27 NOTE — Therapy (Signed)
[facility] [address], [place], [address] Phone: [phone]   Fax:  [phone]  Physical Therapy Treatment  Patient Details  Name: Bailey Perry MRN: [id number] Date of Birth: [birth year] Referring Provider: Lattie Corns, [place]  Encounter Date: 10/27/2016      PT End of Session - 10/27/16 1727    Visit Number 5   Number of Visits 12   Date for PT Re-Evaluation 11/22/16   Authorization Type 5   Authorization Time Period 10   PT Start Time 1616   PT Stop Time 1702   PT Time Calculation (min) 46 min   Activity Tolerance Patient limited by pain   Behavior During Therapy [facility] for tasks assessed/performed      Past Medical History:  Diagnosis Date  . Abnormal Pap smear of cervix     Past Surgical History:  Procedure Laterality Date  . APPENDECTOMY    . CESAREAN SECTION     X 2    There were no vitals filed for this visit.      Subjective Assessment - 10/27/16 1726    Subjective Increased pain in left knee today because she ran up/down steps today.    Limitations Sitting;Standing;Walking;House hold activities   Patient Stated Goals Patient would like to decrease knee pain and be able to work, work second job and exercise   Currently in Pain? Yes   Pain Score 10-Worst pain ever   Pain Location Knee   Pain Orientation Left   Pain Descriptors / Indicators Aching;Tightness   Pain Type Chronic pain   Pain Onset More than a month ago   Pain Frequency Intermittent      Objective: Gait: improved gait pattern with increasing weight bearing LLE, mild limp with ambulation AROM: knee flexion left 0-80 with tightness, pain end range Palpation: Point tender along medial aspect left knee anterior/ ?Pes Anserine bursa  Treatment: Modalities: Electrical stimulation: high volt applied to anterior aspect left knee medial/lateral aspects and [place] stimulation applied to VMO/quadriceps muscle  with patient performing quad setting each cycle 10/10 cycle, intensity to tolerance with goal of neuromuscular re education/pain control Iontophoresis with dexamethasone 4mg /ml @ 40 ma-min applied large electrode to medial aspect of left knee over pes anserine region with patient long sitting with lower leg propped on pillow; 15 min. To apply; patient was instructed in use, possible reactions prior to application; no adverse reactions noted following treatment  Therapeutic exercise: performed with guidance, assistance, VC of therapist Quad setting x 5 reps with verbal and tactile cuing to perform with partial contraction  PF mobilization with patella mobilizer x 3 reps for distraction and medial/lateral glides Sitting knee flexion multi angle isometrics with therapist assist x 3 sets Hip abduction with resistive band x 15 reps Multi angle knee extension isometrics 90, 60 degrees x 3 sets ( increased pain with increased extension)   Patient response to treatment: patient demonstrated improved technique with exercises with moderate VC for correct alignment; uable to tolerate more than mild resistance for isometric knee flexion exercises Improved ROM of knee form 80 to 100 degrees at end of session. Patient with decreased pain from 10/10 to  5/10.         PT Education - 10/27/16 1726    Education provided Yes   Education Details HEP: added hip abduction (clam) with resistive band green, sitting   Person(s) Educated Patient   Methods Explanation;Demonstration;Verbal cues   Comprehension Verbalized understanding;Returned  demonstration;Verbal cues required             PT Long Term Goals - 10/24/16 1710      PT LONG TERM GOAL #1   Title Patient will improve LEFS score to 60/80 demonstrating improved sitting, standing, walking, stair climbing with decreased pain by 11/22/2016   Baseline LEFS 30/80; current 10/24/16 = 16/80 (severe self perceived limitations)   Status On-going     PT LONG  TERM GOAL #2   Title Patient will have full pain free AROM left knee by 11/22/2016 to allow improved function with sitting, stair climbing   Baseline limited AROM left knee 0-105 degrees with pain limiting further motion; current 10/24/16 0-110 with mild discomfort/tight   Status On-going     PT LONG TERM GOAL #3   Title Patient will be independent with home program without cuing to allow self management following discharge from physical therapy 11/22/2016   Baseline limited knowledge of pain control, progression of exercises without maximal cuing   Status On-going               Plan - 10/27/16 1710    Clinical Impression Statement Patient demonstrated improved ability to perform exercises with assistance for correct alignment of LE and correct technique to perform. Tolerated iontophoresis well without adverse reaction noted and should improve with additional physical therapy intervention to address pain and weakness in order to return to full function.    Rehab Potential Good   PT Frequency 2x / week   PT Duration 6 weeks   PT Treatment/Interventions Iontophoresis 4mg /ml Dexamethasone;Electrical Stimulation;Cryotherapy;Moist Heat;Ultrasound;Patient/family education;Neuromuscular re-education;Therapeutic exercise;Manual techniques;Dry needling   PT Next Visit Plan pain control, modalities as indicated, manual therapy STM, therapeutic exercises   PT Home Exercise Plan HEP: quad sets, ROM exercises left knee, SLR, isometric hamstring exercise, pain control      Patient will benefit from skilled therapeutic intervention in order to improve the following deficits and impairments:  Decreased strength, Impaired flexibility, Pain, Decreased activity tolerance, Impaired perceived functional ability, Decreased range of motion, Difficulty walking, Increased muscle spasms  Visit Diagnosis: Acute pain of left knee  Muscle weakness (generalized)     Problem List Patient Active Problem List    Diagnosis Date Noted  . Migraine with aura and without status migrainosus, not intractable 10/26/2015    [name] PT 10/28/2016, 6:50 PM  [place] [facility] [address], [place], [address] Phone: [phone]   Fax:  [phone]  Name: Bailey Perry MRN: [id number] Date of Birth: 12-Sep-1976

## 2016-11-01 ENCOUNTER — Encounter: Payer: Self-pay | Admitting: Physical Therapy

## 2016-11-01 ENCOUNTER — Ambulatory Visit: Payer: No Typology Code available for payment source | Admitting: Physical Therapy

## 2016-11-01 DIAGNOSIS — M25562 Pain in left knee: Secondary | ICD-10-CM | POA: Diagnosis not present

## 2016-11-01 DIAGNOSIS — R262 Difficulty in walking, not elsewhere classified: Secondary | ICD-10-CM

## 2016-11-01 DIAGNOSIS — M6281 Muscle weakness (generalized): Secondary | ICD-10-CM

## 2016-11-02 NOTE — Therapy (Signed)
[facility] [address] [place], [address] Phone: [phone]   Fax:  [phone]  Physical Therapy Treatment  Patient Details  Name: TIERANEY HUETT MRN: [id number] Date of Birth: 12/29/1975 Referring Provider: Lattie Corns, [place]  Encounter Date: 11/01/2016      PT End of Session - 11/01/16 1743    Visit Number 6   Number of Visits 12   Date for PT Re-Evaluation 11/22/16   Authorization Type 6   Authorization Time Period 10   PT Start Time 1710   PT Stop Time 1740   PT Time Calculation (min) 30 min   Activity Tolerance Patient tolerated treatment well   Behavior During Therapy [facility] for tasks assessed/performed      Past Medical History:  Diagnosis Date  . Abnormal Pap smear of cervix     Past Surgical History:  Procedure Laterality Date  . APPENDECTOMY    . CESAREAN SECTION     X 2    There were no vitals filed for this visit.      Subjective Assessment - 11/01/16 1722    Subjective increased pain/swelling in knee. had migraine over the weekend. Reports knee is good one day and bad the next, no reason.    Limitations Sitting;Standing;Walking;House hold activities   Patient Stated Goals Patient would like to decrease knee pain and be able to work, work second job and exercise   Currently in Pain? Yes   Pain Score 4    Pain Location Knee   Pain Orientation Left   Pain Descriptors / Indicators Aching   Pain Type Chronic pain   Pain Onset More than a month ago   Pain Frequency Intermittent        Objective: Gait: antalgic pattern with limp on left Palpation: Point tender along medial aspect left knee anterior/ ?Pes Anserine bursa; less tender than previous session  Treatment: Modalities: Electrical stimulation:  [place] stimulation applied to VMO/quadriceps muscle with patient performing quad setting each cycle 10/10 cycle, intensity to tolerance with goal of neuromuscular  re education/pain control Iontophoresis with dexamethasone 4mg /ml @ 40 ma-min applied large electrode to medial aspect of left knee over pes anserine region with patient long sitting with lower leg propped on pillow; 15 min. To apply; patient was instructed in use, possible reactions prior to application; no adverse reactions noted following treatment  Patient response to treatment: Deferred exercises due to complaints of increased swelling and pain in knee today. Patient with decreased pain from 4/10 to  2/10. No adverse reaction to treatment noted         PT Education - 11/01/16 1745    Education provided Yes   Education Details HEP: continue with exercises as tolerated, modify as needed   Person(s) Educated Patient   Methods Explanation   Comprehension Verbalized understanding             PT Long Term Goals - 10/24/16 1710      PT LONG TERM GOAL #1   Title Patient will improve LEFS score to 60/80 demonstrating improved sitting, standing, walking, stair climbing with decreased pain by 11/22/2016   Baseline LEFS 30/80; current 10/24/16 = 16/80 (severe self perceived limitations)   Status On-going     PT LONG TERM GOAL #2   Title Patient will have full pain free AROM left knee by 11/22/2016 to allow improved function with sitting, stair climbing   Baseline limited AROM left knee 0-105 degrees  with pain limiting further motion; current 10/24/16 0-110 with mild discomfort/tight   Status On-going     PT LONG TERM GOAL #3   Title Patient will be independent with home program without cuing to allow self management following discharge from physical therapy 11/22/2016   Baseline limited knowledge of pain control, progression of exercises without maximal cuing   Status On-going               Plan - 11/01/16 1741    Clinical Impression Statement No adverse effects noted with iontophoresis. Patient is progressing with knee flexion with inconsistent ability to flex knee through  >90 degrees of motion. She continues with antalgic gait pattern intermittently and has inconsistent good/bad days. she should continue with more consistent pain relief with additional iontophoresis sessions and continued exercises as instructed.    Rehab Potential Good   PT Frequency 2x / week   PT Duration 6 weeks   PT Treatment/Interventions Iontophoresis 4mg /ml Dexamethasone;Electrical Stimulation;Cryotherapy;Moist Heat;Ultrasound;Patient/family education;Neuromuscular re-education;Therapeutic exercise;Manual techniques;Dry needling   PT Next Visit Plan pain control, modalities as indicated, manual therapy STM, therapeutic exercises   PT Home Exercise Plan HEP: quad sets, ROM exercises left knee, SLR, isometric hamstring exercise, pain control      Patient will benefit from skilled therapeutic intervention in order to improve the following deficits and impairments:  Decreased strength, Impaired flexibility, Pain, Decreased activity tolerance, Impaired perceived functional ability, Decreased range of motion, Difficulty walking, Increased muscle spasms  Visit Diagnosis: Muscle weakness (generalized)  Acute pain of left knee  Difficulty in walking, not elsewhere classified     Problem List Patient Active Problem List   Diagnosis Date Noted  . Migraine with aura and without status migrainosus, not intractable 10/26/2015    [name] PT 11/02/2016, 2:05 PM  [facility] [address] [place], [address] Phone: [phone]   Fax:  [phone]  Name: ARLISS KOEP MRN: [id number] Date of Birth: 1976/06/09

## 2016-11-03 ENCOUNTER — Ambulatory Visit: Payer: No Typology Code available for payment source | Admitting: Physical Therapy

## 2016-11-07 ENCOUNTER — Encounter: Payer: Self-pay | Admitting: Physical Therapy

## 2016-11-07 ENCOUNTER — Ambulatory Visit: Payer: No Typology Code available for payment source | Admitting: Physical Therapy

## 2016-11-07 DIAGNOSIS — R262 Difficulty in walking, not elsewhere classified: Secondary | ICD-10-CM

## 2016-11-07 DIAGNOSIS — M6281 Muscle weakness (generalized): Secondary | ICD-10-CM

## 2016-11-07 DIAGNOSIS — M25562 Pain in left knee: Secondary | ICD-10-CM

## 2016-11-08 NOTE — Therapy (Signed)
[facility] [facility] [address] [place], [address] Phone: [phone]   Fax:  [phone]  Physical Therapy Treatment  Patient Details  Name: Bailey Perry MRN: [id number] Date of Birth: August 26, 1976 Referring Provider: Lattie Corns, [place]  Encounter Date: 11/07/2016      PT End of Session - 11/07/16 1626    Visit Number 7   Number of Visits 12   Date for PT Re-Evaluation 11/22/16   Authorization Type 7   Authorization Time Period 10   PT Start Time 1530   PT Stop Time 1620   PT Time Calculation (min) 50 min   Activity Tolerance Patient tolerated treatment well   Behavior During Therapy [facility] for tasks assessed/performed      Past Medical History:  Diagnosis Date  . Abnormal Pap smear of cervix     Past Surgical History:  Procedure Laterality Date  . APPENDECTOMY    . CESAREAN SECTION     X 2    There were no vitals filed for this visit.      Subjective Assessment - 11/07/16 1533    Subjective Increased pain left knee today with pain in front of knee just below patella (reports this is the first time she has had this location of pain). Pain is only with walking and after walking all day.  Pain is worse with working during the week, better with outside of the home. pain is intermittent. She requests not iontophoresis today. She reports she had a bad headache Saturday and has felt "swimmy headed" over the past few days, denies headache, feeling swimmy headed today.    Limitations Sitting;Standing;Walking;House hold activities   Patient Stated Goals Patient would like to decrease knee pain and be able to work, work second job and exercise   Currently in Pain? Yes   Pain Score 4   with standing and walking 8/10; 4/10 with sitting still; bending    Pain Location Knee   Pain Orientation Left   Pain Descriptors / Indicators Aching   Pain Type Chronic pain   Pain Onset More than a month ago   Pain  Frequency Intermittent      Objective: Gait: antalgic pattern with decreased weight bearing left LE Palpation: generalized tenderness along medial border and inferior border of patella AAROM: left knee sitting 0-115 degrees flexion with pain/"tightness"  limiting further motion   Treatment: Modalities: Electrical stimulation: high volt applied to medial and lateral aspect left knee and [place] stimulation applied to VMO/quadriceps muscle with patient performing quad setting each cycle 10/10 cycle, intensity to tolerance with goal of neuromuscular re education/pain control Ultrasound 1 MHz 1.3w/cm2 50% pulsed x 10 min. Applied to left knee around patella medially and inferiorly with patient long sitting, LE supported on pillow  Therapeutic exercise: performed with guidance, assistance, VC of therapist Quad setting x 5 reps with verbal and tactile cuing to perform with partial contraction  PF mobilization with patella mobilizer x 3 reps for distraction and medial/lateral glides Sitting knee flexion multi angle isometrics with therapist assist x 3 sets Knee extension multi, angle x 3 sets with 5 second holds Hip abduction with resistive band x 15 reps  Patient response to treatment: patient demonstrated improved technique with exercises with minimal VC for correct alignment, continues with tightness with increased flexion. Improved gait pattern with at least 50% reduction in tenderness and pain following treatment of US/estim. Marland Kitchen  PT Education - 11/07/16 1625    Education provided Yes   Education Details HEP: pain control with exercises as tolerated, modify as needed   Person(s) Educated Patient   Methods Explanation   Comprehension Verbalized understanding             PT Long Term Goals - 10/24/16 1710      PT LONG TERM GOAL #1   Title Patient will improve LEFS score to 60/80 demonstrating improved sitting, standing, walking, stair climbing with decreased pain  by 11/22/2016   Baseline LEFS 30/80; current 10/24/16 = 16/80 (severe self perceived limitations)   Status On-going     PT LONG TERM GOAL #2   Title Patient will have full pain free AROM left knee by 11/22/2016 to allow improved function with sitting, stair climbing   Baseline limited AROM left knee 0-105 degrees with pain limiting further motion; current 10/24/16 0-110 with mild discomfort/tight   Status On-going     PT LONG TERM GOAL #3   Title Patient will be independent with home program without cuing to allow self management following discharge from physical therapy 11/22/2016   Baseline limited knowledge of pain control, progression of exercises without maximal cuing   Status On-going               Plan - 11/07/16 1627    Clinical Impression Statement Patient responded well to [place] with decreased pain and improved ability to walk with less difficulty and knee pain at end of session. She continues with difficulty with bending left knee and walkling. Her pain and difficulty with bending and walking is inconsistent and is therefore responding slowly to treatment.    Rehab Potential Good   PT Frequency 2x / week   PT Duration 6 weeks   PT Treatment/Interventions Iontophoresis 4mg /ml Dexamethasone;Electrical Stimulation;Cryotherapy;Moist Heat;Ultrasound;Patient/family education;Neuromuscular re-education;Therapeutic exercise;Manual techniques;Dry needling   PT Next Visit Plan pain control, modalities as indicated, manual therapy STM, therapeutic exercises   PT Home Exercise Plan HEP: quad sets, ROM exercises left knee, SLR, isometric hamstring exercise, pain control      Patient will benefit from skilled therapeutic intervention in order to improve the following deficits and impairments:  Decreased strength, Impaired flexibility, Pain, Decreased activity tolerance, Impaired perceived functional ability, Decreased range of motion, Difficulty walking, Increased muscle spasms  Visit  Diagnosis: Muscle weakness (generalized)  Acute pain of left knee  Difficulty in walking, not elsewhere classified     Problem List Patient Active Problem List   Diagnosis Date Noted  . Migraine with aura and without status migrainosus, not intractable 10/26/2015    [name] PT 11/08/2016, 9:53 PM  [place] [address] [place], [address] Phone: [phone]   Fax:  [phone]  Name: Bailey Perry MRN: [id number] Date of Birth: January 30, 1976

## 2016-11-09 ENCOUNTER — Ambulatory Visit: Payer: No Typology Code available for payment source | Admitting: Physical Therapy

## 2016-11-09 DIAGNOSIS — M25562 Pain in left knee: Secondary | ICD-10-CM

## 2016-11-09 DIAGNOSIS — M6281 Muscle weakness (generalized): Secondary | ICD-10-CM

## 2016-11-09 DIAGNOSIS — R262 Difficulty in walking, not elsewhere classified: Secondary | ICD-10-CM

## 2016-11-09 NOTE — Therapy (Signed)
[facility] [address] [place], [address] Phone: [phone]   Fax:  [phone]  Physical Therapy Treatment  Patient Details  Name: Bailey Perry MRN: [id number] Date of Birth: 09-Oct-1976 Referring Provider: Lattie Corns, [place]  Encounter Date: 11/09/2016      PT End of Session - 11/09/16 1701    Visit Number 8   Number of Visits 12   Date for PT Re-Evaluation 11/22/16   Authorization Type 8   Authorization Time Period 10   PT Start Time 1614   PT Stop Time 1652   PT Time Calculation (min) 38 min   Activity Tolerance Patient tolerated treatment well   Behavior During Therapy [facility] for tasks assessed/performed      Past Medical History:  Diagnosis Date  . Abnormal Pap smear of cervix     Past Surgical History:  Procedure Laterality Date  . APPENDECTOMY    . CESAREAN SECTION     X 2    There were no vitals filed for this visit.      Subjective Assessment - 11/09/16 1624    Subjective Patient reports she believes she had good relief from the ulstrasound from last session. She is not having stiffness today because she did not go to work. she is cooking quite a bit however that does not seem to aggravate her knee.    Limitations Sitting;Standing;Walking;House hold activities   Patient Stated Goals Patient would like to decrease knee pain and be able to work, work second job and exercise   Currently in Pain? No/denies      Objective: Gait: Improved gait pattern without limp Palpation: generalized tenderness along medial border and inferior border of patella, decreased as compared to previous session AAROM: left knee sitting 0-125 with minimal tightness   Treatment: Modalities: Electrical stimulation: high volt applied to medial and lateral aspect left knee and [place] stimulation applied to VMO/quadriceps muscle with patient performing quad setting each cycle 10/10 cycle,  intensity to tolerance with goal of neuromuscular re education/pain control Ultrasound 1 MHz 1.3w/cm2 50% pulsed x 10 min. Applied to left knee around patella medially and inferiorly with patient long sitting, LE supported on pillow Therapeutic exercise: performed with guidance, assistance, VC of therapist Quad setting x 5 reps with verbal and tactile cuing to perform with partial contraction      Patient response to treatment: patient demonstrated improved technique with exercises with minimal VC for correct alignment, continues with tightness with increased flexion. Improved gait pattern with at least 50% reduction in    tenderness and pain following treatment of US/estim. Marland Kitchen         PT Education - 11/09/16 1700    Education provided Yes   Education Details HEP: work on ROM, side stepping wiht resistive band around thighs, SLR   Person(s) Educated Patient   Methods Explanation;Demonstration;Verbal cues   Comprehension Verbalized understanding;Returned demonstration;Verbal cues required             PT Long Term Goals - 10/24/16 1710      PT LONG TERM GOAL #1   Title Patient will improve LEFS score to 60/80 demonstrating improved sitting, standing, walking, stair climbing with decreased pain by 11/22/2016   Baseline LEFS 30/80; current 10/24/16 = 16/80 (severe self perceived limitations)   Status On-going     PT LONG TERM GOAL #2   Title Patient will have full pain free AROM left knee by 11/22/2016 to allow  improved function with sitting, stair climbing   Baseline limited AROM left knee 0-105 degrees with pain limiting further motion; current 10/24/16 0-110 with mild discomfort/tight   Status On-going     PT LONG TERM GOAL #3   Title Patient will be independent with home program without cuing to allow self management following discharge from physical therapy 11/22/2016   Baseline limited knowledge of pain control, progression of exercises without maximal cuing   Status On-going                Plan - 11/09/16 1702    Clinical Impression Statement Patient responding well to [place] with good carry over with increased ROM to 125 without stiffness or pain. 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 She will benefit from additional physical therapy intervention to advance exercises to transition to home program of self management.    Rehab Potential Good   PT Frequency 2x / week   PT Duration 6 weeks   PT Treatment/Interventions Iontophoresis 4mg /ml Dexamethasone;Electrical Stimulation;Cryotherapy;Moist Heat;Ultrasound;Patient/family education;Neuromuscular re-education;Therapeutic exercise;Manual techniques;Dry needling   PT Next Visit Plan pain control, modalities as indicated, manual therapy STM, therapeutic exercises   PT Home Exercise Plan HEP: quad sets, ROM exercises left knee, SLR, isometric hamstring exercise, pain control, walk forward and backwards, side stepping with resistive ban around thighs      Patient will benefit from skilled therapeutic intervention in order to improve the following deficits and impairments:  Decreased strength, Impaired flexibility, Pain, Decreased activity tolerance, Impaired perceived functional ability, Decreased range of motion, Difficulty walking, Increased muscle spasms  Visit Diagnosis: Muscle weakness (generalized)  Acute pain of left knee  Difficulty in walking, not elsewhere classified     Problem List Patient Active Problem List   Diagnosis Date Noted  . Migraine with aura and without status migrainosus, not intractable 10/26/2015    [name] PT 11/10/2016, 9:31 PM  [facility] [address] [place], [address] Phone: [phone]   Fax:  [phone]  Name: Bailey Perry MRN: [id number] Date of Birth: 1976-09-23

## 2016-11-14 ENCOUNTER — Encounter: Payer: Self-pay | Admitting: Physical Therapy

## 2016-11-14 ENCOUNTER — Ambulatory Visit: Payer: No Typology Code available for payment source | Admitting: Physical Therapy

## 2016-11-14 DIAGNOSIS — M25562 Pain in left knee: Secondary | ICD-10-CM | POA: Diagnosis not present

## 2016-11-14 DIAGNOSIS — R262 Difficulty in walking, not elsewhere classified: Secondary | ICD-10-CM

## 2016-11-14 DIAGNOSIS — M6281 Muscle weakness (generalized): Secondary | ICD-10-CM

## 2016-11-15 NOTE — Therapy (Signed)
[facility] [facility] [address] [place], [address] Phone: [phone]   Fax:  [phone]  Physical Therapy Treatment  Patient Details  Name: Bailey Perry MRN: [id number] Date of Birth: 1976-12-11 Referring Provider: Lattie Corns, [place]  Encounter Date: 11/14/2016      PT End of Session - 11/14/16 1705    Visit Number 9   Number of Visits 12   Date for PT Re-Evaluation 11/22/16   Authorization Type 9   Authorization Time Period 10   PT Start Time 1620   PT Stop Time 1700   PT Time Calculation (min) 40 min   Activity Tolerance Patient tolerated treatment well;Patient limited by pain   Behavior During Therapy [facility] for tasks assessed/performed      Past Medical History:  Diagnosis Date  . Abnormal Pap smear of cervix     Past Surgical History:  Procedure Laterality Date  . APPENDECTOMY    . CESAREAN SECTION     X 2    There were no vitals filed for this visit.      Subjective Assessment - 11/14/16 1600    Subjective Patient reports she is having pain in left knee today due to being back at work and on feet all day.   Patient Stated Goals Patient would like to decrease knee pain and be able to work, work second job and exercise   Currently in Pain? Yes   Pain Score 7    Pain Location Knee   Pain Orientation Left   Pain Descriptors / Indicators Aching;Tightness   Pain Type Chronic pain   Pain Onset More than a month ago   Pain Frequency Intermittent     Objective: Gait: antalgic pattern with decreased weight bearing left LE Palpation: generalized tenderness along medial border and inferior border of patella AAROM: left knee sitting 0- 110 degrees flexion with pain/"tightness" limited  limiting further motion   Treatment: Modalities: Electrical stimulation: high volt applied to medial and lateral aspect left knee and [place] stimulation applied to VMO/quadriceps muscle with patient  performing quad setting each cycle 10/10 cycle, intensity to tolerance with goal of neuromuscular re education/pain control Ultrasound 1 MHz 1.3w/cm2 50% pulsed x 10 min. Applied to left knee around patella medially and inferiorly with patient long sitting, LE supported on pillow  Therapeutic exercise: performed with guidance, assistance, VC of therapist Quad setting x 5 reps with verbal and tactile cuing to perform with partial contraction  Sitting AAROM knee flexion left through tolerated ROM    Patient response to treatment: Patient required assistance to perform knee flexion and demonstrated decreased tenderness by 30% in left knee following [place] treatment.  She continued with pain in left knee with weight bearing following session with minimal changes reported       PT Education - 11/14/16 1645    Education provided Yes   Education Details HEP: continue with exercises for ROM, strengthening as tolerated   Person(s) Educated Patient   Methods Explanation   Comprehension Verbalized understanding             PT Long Term Goals - 10/24/16 1710      PT LONG TERM GOAL #1   Title Patient will improve LEFS score to 60/80 demonstrating improved sitting, standing, walking, stair climbing with decreased pain by 11/22/2016   Baseline LEFS 30/80; current 10/24/16 = 16/80 (severe self perceived limitations)   Status On-going     PT LONG TERM  GOAL #2   Title Patient will have full pain free AROM left knee by 11/22/2016 to allow improved function with sitting, stair climbing   Baseline limited AROM left knee 0-105 degrees with pain limiting further motion; current 10/24/16 0-110 with mild discomfort/tight   Status On-going     PT LONG TERM GOAL #3   Title Patient will be independent with home program without cuing to allow self management following discharge from physical therapy 11/22/2016   Baseline limited knowledge of pain control, progression of exercises without maximal cuing   Status  On-going               Plan - 11/14/16 1710    Clinical Impression Statement Patient responded well to [place] with decreased stiffness in left knee and decreased tenderness to allow improved abiltiy to weight bear through left LE following session. She continues with increased pain in left knee with prolonged standing and walking at work and at the end of her day. She is better on days off from work.    Rehab Potential Good   PT Frequency 2x / week   PT Duration 6 weeks   PT Treatment/Interventions Iontophoresis 4mg /ml Dexamethasone;Electrical Stimulation;Cryotherapy;Moist Heat;Ultrasound;Patient/family education;Neuromuscular re-education;Therapeutic exercise;Manual techniques;Dry needling   PT Next Visit Plan pain control, modalities as indicated, manual therapy STM, therapeutic exercises   PT Home Exercise Plan HEP: quad sets, ROM exercises left knee, SLR, isometric hamstring exercise, pain control, walk forward and backwards, side stepping with resistive ban around thighs      Patient will benefit from skilled therapeutic intervention in order to improve the following deficits and impairments:  Decreased strength, Impaired flexibility, Pain, Decreased activity tolerance, Impaired perceived functional ability, Decreased range of motion, Difficulty walking, Increased muscle spasms  Visit Diagnosis: Muscle weakness (generalized)  Acute pain of left knee  Difficulty in walking, not elsewhere classified     Problem List Patient Active Problem List   Diagnosis Date Noted  . Migraine with aura and without status migrainosus, not intractable 10/26/2015    [name] PT 11/15/2016, 8:04 PM  [place] [address] [place], [address] Phone: [phone]   Fax:  [phone]  Name: Bailey Perry MRN: [id number] Date of Birth: 12-17-1976

## 2016-11-16 ENCOUNTER — Encounter: Payer: Self-pay | Admitting: Physical Therapy

## 2016-11-16 ENCOUNTER — Ambulatory Visit: Payer: No Typology Code available for payment source | Admitting: Physical Therapy

## 2016-11-16 DIAGNOSIS — R262 Difficulty in walking, not elsewhere classified: Secondary | ICD-10-CM

## 2016-11-16 DIAGNOSIS — M6281 Muscle weakness (generalized): Secondary | ICD-10-CM

## 2016-11-16 DIAGNOSIS — M25562 Pain in left knee: Secondary | ICD-10-CM

## 2016-11-16 NOTE — Therapy (Signed)
[facility] [address], [place], [address] Phone: [phone]   Fax:  [phone]  Physical Therapy Treatment/Discharge Summary  Patient Details  Name: Bailey Perry MRN: [id number] Date of Birth: [birth year] Referring Provider: Lattie Corns, PA  Encounter Date: 11/16/2016   Patient began physical therapy 10/11/2016 and attended 10 sessions through 11/16/2016 with goals partially met and continued symptoms that are intermittent without consistent cause/effect and will need to be re evaluated to determine possible source of continued symptoms. Plan discharge from physical therapy for further evaluation by MD.       PT End of Session - 11/16/16 1729    Visit Number 10   Number of Visits 12   Date for PT Re-Evaluation 11/22/16   Authorization Type 10   Authorization Time Period 10 (insurance coverage; limited)   PT Start Time 1610   PT Stop Time 1655   PT Time Calculation (min) 45 min   Activity Tolerance Patient tolerated treatment well;Patient limited by pain   Behavior During Therapy WFL for tasks assessed/performed      Past Medical History:  Diagnosis Date  . Abnormal Pap smear of cervix     Past Surgical History:  Procedure Laterality Date  . APPENDECTOMY    . CESAREAN SECTION     X 2    There were no vitals filed for this visit.      Subjective Assessment - 11/16/16 1726    Subjective Patient reports intermittent pain in left knee that comes and goes and is described as tightness most of the time; today it is painful. She reports that the way she is walking on left leg is now affecting her right leg with pain. she has has temporary relief of symptoms with therapy however no lasting relief of pain/stiffness and she is still very limited in daily tasks.    Limitations Sitting;Standing;Walking;House hold activities   Patient Stated Goals Patient would like to decrease knee pain and  be able to work, work second job and exercise   Currently in Pain? Yes   Pain Score 7    Pain Location Knee   Pain Orientation Left   Pain Descriptors / Indicators Aching;Tightness  increased pain with weight bearing today   Pain Type Chronic pain   Pain Onset More than a month ago      Objective: Initially 10/11/2016 Posture: standing: decreased weight bearing left LE with left knee with knee in slight flexion AROM: right knee 0-130, left knee 0-105 with pain limiting further motion Strength: grossly tested  Hip flexion right 4/5, left 4-/5 Hip abduction right 4/5, left 4-/5 Hip ER right 4/5, left 4-/5 Knee extension right 5/5, left 4/5 Knee flexion right 4/5 left 4-/5 with pain reported in left knee medial aspect Ankle DF right 5/5, left 4/5 with reported increased knee pain (note: shaking with testing for ankle DF, knee flexion /extension and hip flexion left>right) Palpation: decreased soft tissue elasticity distal left quadriceps, + tenderness posterior aspect left knee and anterior aspect along bilateral patellar tendon Gait: ambulating independently without AD with decreased cadence and guarded posture Outcome measure: LEFS 30/80 (80 = no self perceived disability)  Current assessment 11/16/2016:  Objective: Gait: antalgic pattern with decreased weight bearing left LE Palpation: generalized tenderness along medial border and inferior border of patella AAROM: left knee sitting 0- 110 degrees flexion with pain/"tightness" limited limiting further motion (Note: patient has achieved up to 125 degrees of flexion  within the past week) Strength: left LE grossly WFL's without reproduction of symptoms with resistance to major muscle groups Valgus/Varus stress left knee no reproduction of symptoms Outcome measure: LEFS = 26/80 (80 = no self perceived disability: no significant change from initial evaluation)  Treatment: Modalities: Electrical stimulation: high volt applied to  medial and lateralaspect left knee and [place] stimulation applied to VMO/quadriceps muscle with patient performing quad setting each cycle 10/10 cycle, intensity to tolerance with goal of neuromuscular re education/pain control  Ultrasound 1 MHz 1.3w/cm2 50% pulsed x 10 min. Applied to left knee around patella medially and inferiorly with patient long sitting, LE supported on pillow  Therapeutic exercise: performed with guidance, assistance, VC of therapist Quad setting x 5 reps with verbal and tactile cuing to perform with partial contraction  Sitting AAROM knee flexion left through tolerated ROM  Reviewed exercises for strength/flexibility    Patient response to treatment: Patient demonstrated decreased tenderness left knee >30% following [place]. She continued with limited AROM left knee flexion to 110 with pain limiting further motion. Reported increased pain medial aspect of left knee with weight bearing at end of treatment.          PT Education - 11/16/16 1655    Education provided Yes   Education Details HEP; reviewed verbally home program: patient has handout   Person(s) Educated Patient   Methods Explanation   Comprehension Verbalized understanding             PT Long Term Goals - 11/16/16 1733      PT LONG TERM GOAL #1   Title Patient will improve LEFS score to 60/80 demonstrating improved sitting, standing, walking, stair climbing with decreased pain by 11/22/2016   Baseline LEFS 30/80; current 10/24/16 = 16/80 (severe self perceived limitations); current 11/16/16 26/80 (80 = no self perceived disability): no significant changes noted   Status Not Met     PT LONG TERM GOAL #2   Title Patient will have full pain free AROM left knee by 11/22/2016 to allow improved function with sitting, stair climbing   Baseline limited AROM left knee 0-105 degrees with pain limiting further motion; current 10/24/16 0-110 with mild discomfort/tight: patient able to achieve 125 degrees  flexion on days not working    Status Not Met     PT LONG TERM GOAL #3   Title Patient will be independent with home program without cuing to allow self management following discharge from physical therapy 11/22/2016   Baseline limited knowledge of pain control, progression of exercises without maximal cuing; patient verbalized good understanding of exercises   Status Achieved               Plan - 11/16/16 1730    Clinical Impression Statement Limited results with therapy intervention of pain control: [place], estim. mobilizations for PF joint, STM, therapeutic exercises to improve pain free ROM, strength. She has had full AROM during one session on a day she did not go to work with AROM 0-125 degrees flexion and equal to right LE. She has a home program to continue with and has not met goals due to continued pain limiting further progression of treatment. Recommend return to MD to discuss further diagnostic testing to determine source of pain.    Rehab Potential Good   Clinical Impairments Affecting Rehab Potential (+)acute condition, age, motivated, improving (-) significant limited motion   PT Frequency 2x / week   PT Duration 6 weeks   PT Treatment/Interventions Iontophoresis 1m/ml  Dexamethasone;Electrical Stimulation;Cryotherapy;Moist Heat;Ultrasound;Patient/family education;Neuromuscular re-education;Therapeutic exercise;Manual techniques;Dry needling   PT Next Visit Plan pain control, modalities as indicated, manual therapy STM, therapeutic exercises   PT Home Exercise Plan HEP: quad sets, ROM exercises left knee, SLR, isometric hamstring exercise, pain control, walk forward and backwards, side stepping with resistive ban around thighs      Patient will benefit from skilled therapeutic intervention in order to improve the following deficits and impairments:  Decreased strength, Impaired flexibility, Pain, Decreased activity tolerance, Impaired perceived functional ability, Decreased  range of motion, Difficulty walking, Increased muscle spasms  Visit Diagnosis: Muscle weakness (generalized)  Acute pain of left knee  Difficulty in walking, not elsewhere classified     Problem List Patient Active Problem List   Diagnosis Date Noted  . Migraine with aura and without status migrainosus, not intractable 10/26/2015    [name] PT 11/16/2016, 5:36 PM  [facility] [address], [place], [address] Phone: [phone]   Fax:  [phone]  Name: Bailey Perry MRN: [id number] Date of Birth: 02-13-1976

## 2016-11-21 ENCOUNTER — Encounter: Payer: No Typology Code available for payment source | Admitting: Physical Therapy

## 2016-11-23 ENCOUNTER — Encounter: Payer: No Typology Code available for payment source | Admitting: Physical Therapy

## 2016-11-28 ENCOUNTER — Encounter: Payer: No Typology Code available for payment source | Admitting: Physical Therapy

## 2016-11-30 ENCOUNTER — Encounter: Payer: No Typology Code available for payment source | Admitting: Physical Therapy

## 2016-12-05 ENCOUNTER — Encounter: Payer: No Typology Code available for payment source | Admitting: Physical Therapy

## 2016-12-07 ENCOUNTER — Encounter: Payer: No Typology Code available for payment source | Admitting: Physical Therapy

## 2017-09-19 ENCOUNTER — Ambulatory Visit (INDEPENDENT_AMBULATORY_CARE_PROVIDER_SITE_OTHER): Payer: PRIVATE HEALTH INSURANCE | Admitting: Family Medicine

## 2017-09-19 ENCOUNTER — Encounter: Payer: Self-pay | Admitting: Family Medicine

## 2017-09-19 VITALS — BP 126/87 | HR 69 | Temp 98.9°F | Ht 63.4 in | Wt 213.4 lb

## 2017-09-19 DIAGNOSIS — Z23 Encounter for immunization: Secondary | ICD-10-CM | POA: Diagnosis not present

## 2017-09-19 DIAGNOSIS — R519 Headache, unspecified: Secondary | ICD-10-CM

## 2017-09-19 DIAGNOSIS — R5383 Other fatigue: Secondary | ICD-10-CM

## 2017-09-19 DIAGNOSIS — R51 Headache: Secondary | ICD-10-CM | POA: Diagnosis not present

## 2017-09-19 DIAGNOSIS — M2550 Pain in unspecified joint: Secondary | ICD-10-CM | POA: Diagnosis not present

## 2017-09-19 DIAGNOSIS — Z13220 Encounter for screening for lipoid disorders: Secondary | ICD-10-CM

## 2017-09-19 DIAGNOSIS — G43109 Migraine with aura, not intractable, without status migrainosus: Secondary | ICD-10-CM | POA: Diagnosis not present

## 2017-09-19 DIAGNOSIS — R8281 Pyuria: Secondary | ICD-10-CM

## 2017-09-19 DIAGNOSIS — K219 Gastro-esophageal reflux disease without esophagitis: Secondary | ICD-10-CM | POA: Diagnosis not present

## 2017-09-19 DIAGNOSIS — N390 Urinary tract infection, site not specified: Secondary | ICD-10-CM | POA: Diagnosis not present

## 2017-09-19 MED ORDER — TOPIRAMATE 50 MG PO TABS: ORAL_TABLET | ORAL | 1 refills | Status: DC

## 2017-09-19 MED ORDER — OMEPRAZOLE 40 MG PO CPDR: 40.0000 mg | DELAYED_RELEASE_CAPSULE | Freq: Every day | ORAL | 3 refills | Status: DC

## 2017-09-19 MED ORDER — SUMATRIPTAN SUCCINATE 100 MG PO TABS: 100.0000 mg | ORAL_TABLET | ORAL | 0 refills | Status: DC | PRN

## 2017-09-19 NOTE — Patient Instructions (Addendum)

## 2017-09-19 NOTE — Progress Notes (Signed)
BP 126/87 (BP Location: Left Arm, Patient Position: Sitting, Cuff Size: Large)   Pulse 69   Temp 98.9 F (37.2 C)   Ht 5' 3.4" (1.61 m)   Wt 213 lb 6 oz (96.8 kg)   SpO2 100%   BMI 37.32 kg/m    Subjective:    Patient ID: Bailey Perry, female    DOB: 05-25-1976, 41 y.o.   MRN: [id number]  HPI: Bailey Perry is a 41 y.o. female  Chief Complaint  Patient presents with  . Fatigue  . Headache  . Heartburn  . Weight Gain   Has not been feeling like herself. Has been having headaches and heartburn. Notes that she is having trouble seeing close up. Hasn't seen an eye doctor in a few years. Notes that she was having headaches when she was on OCP, stopped when she switched to the IUD. Now headaches seem to be coming differently.   MIGRAINES- has been having migraines that start behind her R eye Duration: couple of months Onset: sudden Severity: severe Quality: sharp Frequency: 2x a month Location: start behind the R eye and then go around the whole head Headache duration: couple of days to a week Radiation: no Time of day headache occurs: at random Alleviating factors: laying down Aggravating factors: light Headache status at time of visit: asymptomatic Treatments attempted: rest and ibuprofen   Aura: yes Nausea:  yes Vomiting: no Photophobia:  yes Phonophobia:  no Effect on social functioning:  no Confusion:  yes- 1x that happened Gait disturbance/ataxia:  yes Behavioral changes:  no Fevers:  no  FATIGUE Duration:  A few months Severity: mild  Onset: sudden Context when symptoms started:  unknown Symptoms improve with rest: no  Depressive symptoms: yes Stress/anxiety: no Insomnia: no  Snoring: yes Observed apnea by bed partner: no Daytime hypersomnolence:no Wakes feeling refreshed: no History of sleep study: no Dysnea on exertion:  no Orthopnea/PND: no Chest pain: no Chronic cough: no Lower extremity edema: yes Arthralgias:yes Myalgias:  no Weakness: no Rash: no Depression screen [facility] 2/9 09/19/2017 10/26/2015  Decreased Interest 2 0  Down, Depressed, Hopeless 3 0  PHQ - 2 Score 5 0  Altered sleeping 3 -  Tired, decreased energy 3 -  Change in appetite 0 -  Feeling bad or failure about yourself  1 -  Trouble concentrating 0 -  Moving slowly or fidgety/restless 0 -  Suicidal thoughts 0 -  PHQ-9 Score 12 -   GERD GERD control status: uncontrolled  Satisfied with current treatment? Not on anything Heartburn frequency: unknown Antacid use frequency:  Every 2 weeks or so Duration: 2 days Nature: burning  Location: chest Dysphagia: yes Odynophagia:  no Hematemesis: no Blood in stool: no EGD: no  Relevant past medical, surgical, family and social history reviewed and updated as indicated. Interim medical history since our last visit reviewed. Allergies and medications reviewed and updated.  Review of Systems  Constitutional: Positive for fatigue and unexpected weight change. Negative for activity change, appetite change, chills, diaphoresis and fever.  HENT: Negative.   Respiratory: Negative.   Cardiovascular: Negative.   Gastrointestinal: Positive for nausea. Negative for abdominal distention, abdominal pain, anal bleeding, blood in stool, constipation, diarrhea, rectal pain and vomiting.  Neurological: Positive for headaches. Negative for dizziness, tremors, seizures, syncope, facial asymmetry, speech difficulty, weakness, light-headedness and numbness.  Psychiatric/Behavioral: Positive for dysphoric mood. Negative for agitation, behavioral problems, confusion, decreased concentration, hallucinations, self-injury, sleep disturbance and suicidal ideas. The patient is not  nervous/anxious and is not hyperactive.     Per HPI unless specifically indicated above     Objective:    BP 126/87 (BP Location: Left Arm, Patient Position: Sitting, Cuff Size: Large)   Pulse 69   Temp 98.9 F (37.2 C)   Ht 5' 3.4" (1.61  m)   Wt 213 lb 6 oz (96.8 kg)   SpO2 100%   BMI 37.32 kg/m   Wt Readings from Last 3 Encounters:  09/19/17 213 lb 6 oz (96.8 kg)  05/09/16 204 lb 1.6 oz (92.6 kg)  04/12/16 205 lb 3.2 oz (93.1 kg)    Physical Exam  Constitutional: She is oriented to person, place, and time. She appears well-developed and well-nourished. No distress.  HENT:  Head: Normocephalic and atraumatic.  Right Ear: Hearing normal.  Left Ear: Hearing normal.  Nose: Nose normal.  Eyes: Conjunctivae and lids are normal. Right eye exhibits no discharge. Left eye exhibits no discharge. No scleral icterus.  Cardiovascular: Normal rate, regular rhythm, normal heart sounds and intact distal pulses.  Exam reveals no gallop and no friction rub.   No murmur heard. Pulmonary/Chest: Effort normal and breath sounds normal. No respiratory distress. She has no wheezes. She has no rales. She exhibits no tenderness.  Abdominal: Soft. Bowel sounds are normal. She exhibits no distension and no mass. There is no tenderness. There is no rebound and no guarding.  Musculoskeletal: Normal range of motion.  Neurological: She is alert and oriented to person, place, and time.  Skin: Skin is warm, dry and intact. No rash noted. She is not diaphoretic. No erythema. No pallor.  Psychiatric: She has a normal mood and affect. Her speech is normal and behavior is normal. Judgment and thought content normal. Cognition and memory are normal.    Results for orders placed or performed in visit on 09/19/17  Microscopic Examination  Result Value Ref Range   WBC, UA 0-5 0 - 5 /hpf   RBC, UA None seen 0 - 2 /hpf   Epithelial Cells (non renal) 0-10 0 - 10 /hpf   Bacteria, UA None seen None seen/Few  Comprehensive metabolic panel  Result Value Ref Range   Glucose 81 65 - 99 mg/dL   BUN 9 6 - 24 mg/dL   Creatinine, Ser 0.88 0.57 - 1.00 mg/dL   GFR calc non Af Amer 81 >59 mL/min/1.73   GFR calc Af Amer 93 >59 mL/min/1.73   BUN/Creatinine Ratio 10  9 - 23   Sodium 142 134 - 144 mmol/L   Potassium 4.0 3.5 - 5.2 mmol/L   Chloride 105 96 - 106 mmol/L   CO2 24 20 - 29 mmol/L   Calcium 9.0 8.7 - 10.2 mg/dL   Total Protein 7.2 6.0 - 8.5 g/dL   Albumin 4.1 3.5 - 5.5 g/dL   Globulin, Total 3.1 1.5 - 4.5 g/dL   Albumin/Globulin Ratio 1.3 1.2 - 2.2   Bilirubin Total 0.5 0.0 - 1.2 mg/dL   Alkaline Phosphatase 93 39 - 117 IU/L   AST 21 0 - 40 IU/L   ALT 16 0 - 32 IU/L  Lipid Panel w/o Chol/HDL Ratio  Result Value Ref Range   Cholesterol, Total 167 100 - 199 mg/dL   Triglycerides 48 0 - 149 mg/dL   HDL 60 >39 mg/dL   VLDL Cholesterol Cal 10 5 - 40 mg/dL   LDL Calculated 97 0 - 99 mg/dL  Thyroid Panel With TSH  Result Value Ref Range   TSH  1.030 0.450 - 4.500 uIU/mL   T4, Total 7.2 4.5 - 12.0 ug/dL   T3 Uptake Ratio 24 24 - 39 %   Free Thyroxine Index 1.7 1.2 - 4.9  VITAMIN D 25 Hydroxy (Vit-D Deficiency, Fractures)  Result Value Ref Range   Vit D, 25-Hydroxy 15.6 (L) 30.0 - 100.0 ng/mL  UA/M w/rflx Culture, Routine  Result Value Ref Range   Specific Gravity, UA 1.020 1.005 - 1.030   pH, UA 7.0 5.0 - 7.5   Color, UA Yellow Yellow   Appearance Ur Clear Clear   Leukocytes, UA Negative Negative   Protein, UA Negative Negative/Trace   Glucose, UA Negative Negative   Ketones, UA Negative Negative   RBC, UA Negative Negative   Bilirubin, UA Negative Negative   Urobilinogen, Ur 2.0 (H) 0.2 - 1.0 mg/dL   Nitrite, UA Negative Negative   Microscopic Examination See below:   Prolactin  Result Value Ref Range   Prolactin 5.5 4.8 - 23.3 ng/mL  H. pylori antibody, IgG  Result Value Ref Range   H. pylori, IgG AbS <0.80 0.00 - 0.79 Index Value  Lyme Ab/Western Blot Reflex  Result Value Ref Range   Lyme IgG/IgM Ab <0.91 0.00 - 0.90 ISR   LYME DISEASE AB, QUANT, IGM <0.80 0.00 - 0.79 index  Rocky mtn spotted fvr abs pnl(IgG+IgM)  Result Value Ref Range   RMSF IgG Negative Negative   RMSF IgM 0.44 0.00 - 4.03 index  Ehrlichia Antibody  Panel  Result Value Ref Range   E.Chaffeensis (HME) IgG Negative Neg:<1:64   E. Chaffeensis (HME) IgM Titer Negative Neg:<1:20   HGE IgG Titer Negative Neg:<1:64   HGE IgM Titer Negative Neg:<1:20  Babesia microti Antibody Panel  Result Value Ref Range   Babesia microti IgM <1:10 Neg:<1:10   Babesia microti IgG <1:10 Neg:<1:10  CBC with Differential/Platelet  Result Value Ref Range   WBC 6.7 3.4 - 10.8 x10E3/uL   RBC 3.95 3.77 - 5.28 x10E6/uL   Hemoglobin 13.1 11.1 - 15.9 g/dL   Hematocrit 39.4 34.0 - 46.6 %   MCV 100 (H) 79 - 97 fL   MCH 33.2 (H) 26.6 - 33.0 pg   MCHC 33.2 31.5 - 35.7 g/dL   RDW 13.0 12.3 - 15.4 %   Platelets 375 150 - 379 x10E3/uL   Neutrophils 59 Not Estab. %   Lymphs 33 Not Estab. %   Monocytes 6 Not Estab. %   Eos 1 Not Estab. %   Basos 1 Not Estab. %   Neutrophils Absolute 3.9 1.4 - 7.0 x10E3/uL   Lymphocytes Absolute 2.2 0.7 - 3.1 x10E3/uL   Monocytes Absolute 0.4 0.1 - 0.9 x10E3/uL   EOS (ABSOLUTE) 0.1 0.0 - 0.4 x10E3/uL   Basophils Absolute 0.1 0.0 - 0.2 x10E3/uL   Immature Granulocytes 0 Not Estab. %   Immature Grans (Abs) 0.0 0.0 - 0.1 x10E3/uL      Assessment & Plan:   Problem List Items Addressed This Visit      Cardiovascular and Mediastinum   Migraine with aura and without status migrainosus, not intractable - Primary    Migraines have been coming more frequently and severely. Has auras and nausea and photophobia. Has never had an MRI. Will get her an MRI. Will start her on topamax for preventative treatment and imitrex for abortive treatment. Will check labs to look for other causes. Await results. Recheck 1 month. Call with any concerns.       Relevant Medications  topiramate (TOPAMAX) 50 MG tablet   SUMAtriptan (IMITREX) 100 MG tablet   Other Relevant Orders   Thyroid Panel With TSH (Completed)   VITAMIN D 25 Hydroxy (Vit-D Deficiency, Fractures) (Completed)   UA/M w/rflx Culture, Routine (Completed)   Prolactin (Completed)      Digestive   Gastroesophageal reflux disease    Not under good control. Will start her on omeprazole and recheck in 1 month. Call with any concerns.       Relevant Medications   omeprazole (PRILOSEC) 40 MG capsule   Other Relevant Orders   Comprehensive metabolic panel (Completed)   UA/M w/rflx Culture, Routine (Completed)   H. pylori antibody, IgG (Completed)    Other Visit Diagnoses    Immunization due       Flu shot given today.    Relevant Orders   Flu Vaccine QUAD 6+ mos PF IM (Fluarix Quad PF) (Completed)   Fatigue, unspecified type       Likely depression. Will check labs including tick-bourne illnesses. Keep close follow up.    Relevant Orders   CBC with Differential/Platelet   Thyroid Panel With TSH (Completed)   VITAMIN D 25 Hydroxy (Vit-D Deficiency, Fractures) (Completed)   UA/M w/rflx Culture, Routine (Completed)   Lyme Ab/Western Blot Reflex (Completed)   Rocky mtn spotted fvr abs pnl(IgG+IgM) (Completed)   Ehrlichia Antibody Panel (Completed)   Babesia microti Antibody Panel (Completed)   Arthralgia, unspecified joint       Will check for tick diseases. Await results.    Relevant Orders   VITAMIN D 25 Hydroxy (Vit-D Deficiency, Fractures) (Completed)   UA/M w/rflx Culture, Routine (Completed)   Lyme Ab/Western Blot Reflex (Completed)   Rocky mtn spotted fvr abs pnl(IgG+IgM) (Completed)   Ehrlichia Antibody Panel (Completed)   Babesia microti Antibody Panel (Completed)   Nonintractable headache, unspecified chronicity pattern, unspecified headache type       See discussion under Migraine.    Relevant Medications   topiramate (TOPAMAX) 50 MG tablet   SUMAtriptan (IMITREX) 100 MG tablet   Other Relevant Orders   MR Brain Wo Contrast   Screening for cholesterol level       Labs drawn today. Await results.    Relevant Orders   Lipid Panel w/o Chol/HDL Ratio (Completed)       Follow up plan: Return in about 4 weeks (around 10/17/2017) for follow up  migraine/fatigue.

## 2017-09-21 ENCOUNTER — Telehealth: Payer: Self-pay

## 2017-09-21 LAB — CBC WITH DIFFERENTIAL/PLATELET
BASOS: 1 %
Basophils Absolute: 0.1 10*3/uL (ref 0.0–0.2)
EOS (ABSOLUTE): 0.1 10*3/uL (ref 0.0–0.4)
EOS: 1 %
HEMOGLOBIN: 13.1 g/dL (ref 11.1–15.9)
Hematocrit: 39.4 % (ref 34.0–46.6)
IMMATURE GRANS (ABS): 0 10*3/uL (ref 0.0–0.1)
Immature Granulocytes: 0 %
LYMPHS: 33 %
Lymphocytes Absolute: 2.2 10*3/uL (ref 0.7–3.1)
MCH: 33.2 pg — AB (ref 26.6–33.0)
MCHC: 33.2 g/dL (ref 31.5–35.7)
MCV: 100 fL — AB (ref 79–97)
MONOCYTES: 6 %
Monocytes Absolute: 0.4 10*3/uL (ref 0.1–0.9)
NEUTROS ABS: 3.9 10*3/uL (ref 1.4–7.0)
Neutrophils: 59 %
Platelets: 375 10*3/uL (ref 150–379)
RBC: 3.95 x10E6/uL (ref 3.77–5.28)
RDW: 13 % (ref 12.3–15.4)
WBC: 6.7 10*3/uL (ref 3.4–10.8)

## 2017-09-21 LAB — BABESIA MICROTI ANTIBODY PANEL: Babesia microti IgM: <1:10 {titer}

## 2017-09-21 LAB — COMPREHENSIVE METABOLIC PANEL
ALT: 16 IU/L (ref 0–32)
AST: 21 IU/L (ref 0–40)
Albumin/Globulin Ratio: 1.3 (ref 1.2–2.2)
Albumin: 4.1 g/dL (ref 3.5–5.5)
Alkaline Phosphatase: 93 IU/L (ref 39–117)
BUN/Creatinine Ratio: 10 (ref 9–23)
BUN: 9 mg/dL (ref 6–24)
Bilirubin Total: 0.5 mg/dL (ref 0.0–1.2)
CALCIUM: 9 mg/dL (ref 8.7–10.2)
CO2: 24 mmol/L (ref 20–29)
CREATININE: 0.88 mg/dL (ref 0.57–1.00)
Chloride: 105 mmol/L (ref 96–106)
GFR calc Af Amer: 93 mL/min/{1.73_m2} (ref >59)
GFR, EST NON AFRICAN AMERICAN: 81 mL/min/{1.73_m2} (ref >59)
GLUCOSE: 81 mg/dL (ref 65–99)
Globulin, Total: 3.1 g/dL (ref 1.5–4.5)
POTASSIUM: 4 mmol/L (ref 3.5–5.2)
Sodium: 142 mmol/L (ref 134–144)
Total Protein: 7.2 g/dL (ref 6.0–8.5)

## 2017-09-21 LAB — LIPID PANEL W/O CHOL/HDL RATIO
Cholesterol, Total: 167 mg/dL (ref 100–199)
HDL: 60 mg/dL (ref >39)
LDL Calculated: 97 mg/dL (ref 0–99)
TRIGLYCERIDES: 48 mg/dL (ref 0–149)
VLDL Cholesterol Cal: 10 mg/dL (ref 5–40)

## 2017-09-21 LAB — THYROID PANEL WITH TSH
Free Thyroxine Index: 1.7 (ref 1.2–4.9)
T3 Uptake Ratio: 24 % (ref 24–39)
T4, Total: 7.2 ug/dL (ref 4.5–12.0)
TSH: 1.03 u[IU]/mL (ref 0.450–4.500)

## 2017-09-21 LAB — EHRLICHIA ANTIBODY PANEL
E. CHAFFEENSIS (HME) IGM TITER: NEGATIVE
E.Chaffeensis (HME) IgG: NEGATIVE
HGE IGG TITER: NEGATIVE
HGE IGM TITER: NEGATIVE

## 2017-09-21 LAB — ROCKY MTN SPOTTED FVR ABS PNL(IGG+IGM)
RMSF IGG: NEGATIVE
RMSF IGM: 0.44 {index} (ref 0.00–0.89)

## 2017-09-21 LAB — VITAMIN D 25 HYDROXY (VIT D DEFICIENCY, FRACTURES): VIT D 25 HYDROXY: 15.6 ng/mL — AB (ref 30.0–100.0)

## 2017-09-21 LAB — H. PYLORI ANTIBODY, IGG: H. pylori, IgG AbS: <0.8 Index Value (ref 0.00–0.79)

## 2017-09-21 LAB — LYME AB/WESTERN BLOT REFLEX
LYME DISEASE AB, QUANT, IGM: <0.8 index (ref 0.00–0.79)
Lyme IgG/IgM Ab: <0.91 {ISR} (ref 0.00–0.90)

## 2017-09-21 LAB — PROLACTIN: PROLACTIN: 5.5 ng/mL (ref 4.8–23.3)

## 2017-09-21 NOTE — Telephone Encounter (Signed)
Called patient to notify her of her MRI appointment. No answer, LVM to return call.   Wednesday 09/27/17 at 7:00PM (arrive at 6:30) at [facility] Entrance.

## 2017-09-22 ENCOUNTER — Telehealth: Payer: Self-pay | Admitting: Family Medicine

## 2017-09-22 DIAGNOSIS — E559 Vitamin D deficiency, unspecified: Secondary | ICD-10-CM | POA: Insufficient documentation

## 2017-09-22 MED ORDER — VITAMIN D (ERGOCALCIFEROL) 1.25 MG (50000 UNIT) PO CAPS: 50000.0000 [IU] | ORAL_CAPSULE | ORAL | 0 refills | Status: DC

## 2017-09-22 NOTE — Telephone Encounter (Signed)
Message left on patient's voice mail.

## 2017-09-22 NOTE — Telephone Encounter (Signed)
Please let her know that her labs came back normal except her vitamin D which was quite low. I've sent her through an Rx to her pharmacy to bring it back up. Thanks!

## 2017-09-23 DIAGNOSIS — K219 Gastro-esophageal reflux disease without esophagitis: Secondary | ICD-10-CM | POA: Insufficient documentation

## 2017-09-23 NOTE — Assessment & Plan Note (Signed)
Migraines have been coming more frequently and severely. Has auras and nausea and photophobia. Has never had an MRI. Will get her an MRI. Will start her on topamax for preventative treatment and imitrex for abortive treatment. Will check labs to look for other causes. Await results. Recheck 1 month. Call with any concerns.

## 2017-09-23 NOTE — Assessment & Plan Note (Signed)
Not under good control. Will start her on omeprazole and recheck in 1 month. Call with any concerns.

## 2017-09-25 NOTE — Telephone Encounter (Signed)
[phone] patient called stating she was informed by HR that this procedure had to have prior auth before ins would cover it.   Alwyn Ren is checking on this.

## 2017-09-27 ENCOUNTER — Ambulatory Visit: Payer: PRIVATE HEALTH INSURANCE

## 2017-09-27 NOTE — Telephone Encounter (Signed)
Patient notified

## 2017-09-27 NOTE — Telephone Encounter (Signed)
Margaret called from [facility]. MRI Approved.  [place] number: [phone]

## 2017-10-17 ENCOUNTER — Ambulatory Visit (INDEPENDENT_AMBULATORY_CARE_PROVIDER_SITE_OTHER): Payer: No Typology Code available for payment source | Admitting: Family Medicine

## 2017-10-17 ENCOUNTER — Encounter: Payer: Self-pay | Admitting: Family Medicine

## 2017-10-17 VITALS — BP 114/77 | HR 78 | Wt 215.0 lb

## 2017-10-17 DIAGNOSIS — R5383 Other fatigue: Secondary | ICD-10-CM

## 2017-10-17 DIAGNOSIS — E559 Vitamin D deficiency, unspecified: Secondary | ICD-10-CM

## 2017-10-17 DIAGNOSIS — K219 Gastro-esophageal reflux disease without esophagitis: Secondary | ICD-10-CM | POA: Diagnosis not present

## 2017-10-17 DIAGNOSIS — E669 Obesity, unspecified: Secondary | ICD-10-CM | POA: Diagnosis not present

## 2017-10-17 DIAGNOSIS — G43109 Migraine with aura, not intractable, without status migrainosus: Secondary | ICD-10-CM | POA: Diagnosis not present

## 2017-10-17 NOTE — Assessment & Plan Note (Signed)
Resolved. Feeling better. No concerns.

## 2017-10-17 NOTE — Progress Notes (Signed)
BP 114/77   Pulse 78   Wt 215 lb (97.5 kg)   SpO2 99%   BMI 37.61 kg/m    Subjective:    Patient ID: Bailey Perry, female    DOB: Sep 04, 1976, 41 y.o.   MRN: [id number]  HPI: ANGELIK WALLS is a 41 y.o. female  Chief Complaint  Patient presents with  . Migraine    She could not take the Toprimate. It made her sick and dizzy. She hasn't had a single migraine since she was here last.   . Fatigue    Feeling much better after getting the Vit D.   MIGRAINES- has not had any migraines since her last visit. Topamax made her feel sick.  Duration: couple of months Onset: sudden Severity: severe Quality: sharp Frequency: 2x a month- none in the last month.  Location: start behind the R eye and then go around the whole head Headache duration: couple of days to a week Radiation: no Time of day headache occurs: at random Alleviating factors: laying down Aggravating factors: light Headache status at time of visit: asymptomatic Treatments attempted: rest and ibuprofen   Aura: yes Nausea:  yes Vomiting: no Photophobia:  yes Phonophobia:  no Effect on social functioning:  no Confusion:  yes- 1x that happened Gait disturbance/ataxia:  yes Behavioral changes:  no Fevers:  no  FATIGUE- feeling less tired. Feeling more herself.  Duration:  A few months Severity: mild  Onset: sudden Context when symptoms started:  unknown Symptoms improve with rest: no  Depressive symptoms: yes Stress/anxiety: no Insomnia: no  Snoring: yes Observed apnea by bed partner: no Daytime hypersomnolence:no Wakes feeling refreshed: no History of sleep study: no Dysnea on exertion:  no Orthopnea/PND: no Chest pain: no Chronic cough: no Lower extremity edema: yes Arthralgias:yes Myalgias: no Weakness: no Rash: no Depression screen [facility] 2/9 10/17/2017 09/19/2017 10/26/2015  Decreased Interest 0 2 0  Down, Depressed, Hopeless 0 3 0  PHQ - 2 Score 0 5 0  Altered sleeping 0 3 -  Tired,  decreased energy 0 3 -  Change in appetite 0 0 -  Feeling bad or failure about yourself  0 1 -  Trouble concentrating 0 0 -  Moving slowly or fidgety/restless 0 0 -  Suicidal thoughts 0 0 -  PHQ-9 Score 0 12 -   GERD GERD control status: better  Satisfied with current treatment? no Medication side effects: no  Medication compliance: Not on anything Previous GERD medications: omeprazole Dysphagia: no Odynophagia:  no Hematemesis: no Blood in stool: no EGD: no   Hurt her knee a few years ago and has been gaining weight steadily since then. Interested in something to help her lose weight.    Relevant past medical, surgical, family and social history reviewed and updated as indicated. Interim medical history since our last visit reviewed. Allergies and medications reviewed and updated.  Review of Systems  Constitutional: Negative.   Respiratory: Negative.   Cardiovascular: Negative.   Gastrointestinal: Negative.   Psychiatric/Behavioral: Negative.     Per HPI unless specifically indicated above     Objective:    BP 114/77   Pulse 78   Wt 215 lb (97.5 kg)   SpO2 99%   BMI 37.61 kg/m   Wt Readings from Last 3 Encounters:  10/17/17 215 lb (97.5 kg)  09/19/17 213 lb 6 oz (96.8 kg)  05/09/16 204 lb 1.6 oz (92.6 kg)    Physical Exam  Constitutional: She is oriented to  person, place, and time. She appears well-developed and well-nourished. No distress.  HENT:  Head: Normocephalic and atraumatic.  Right Ear: Hearing normal.  Left Ear: Hearing normal.  Nose: Nose normal.  Eyes: Conjunctivae and lids are normal. Right eye exhibits no discharge. Left eye exhibits no discharge. No scleral icterus.  Cardiovascular: Normal rate, regular rhythm, normal heart sounds and intact distal pulses.  Exam reveals no gallop and no friction rub.   No murmur heard. Pulmonary/Chest: Effort normal and breath sounds normal. No respiratory distress. She has no wheezes. She has no rales. She  exhibits no tenderness.  Musculoskeletal: Normal range of motion.  Neurological: She is alert and oriented to person, place, and time.  Skin: Skin is warm, dry and intact. No rash noted. She is not diaphoretic. No erythema. No pallor.  Psychiatric: She has a normal mood and affect. Her speech is normal and behavior is normal. Judgment and thought content normal. Cognition and memory are normal.  Nursing note and vitals reviewed.   Results for orders placed or performed in visit on 09/19/17  Microscopic Examination  Result Value Ref Range   WBC, UA 0-5 0 - 5 /hpf   RBC, UA None seen 0 - 2 /hpf   Epithelial Cells (non renal) 0-10 0 - 10 /hpf   Bacteria, UA None seen None seen/Few  Comprehensive metabolic panel  Result Value Ref Range   Glucose 81 65 - 99 mg/dL   BUN 9 6 - 24 mg/dL   Creatinine, Ser 0.88 0.57 - 1.00 mg/dL   GFR calc non Af Amer 81 >59 mL/min/1.73   GFR calc Af Amer 93 >59 mL/min/1.73   BUN/Creatinine Ratio 10 9 - 23   Sodium 142 134 - 144 mmol/L   Potassium 4.0 3.5 - 5.2 mmol/L   Chloride 105 96 - 106 mmol/L   CO2 24 20 - 29 mmol/L   Calcium 9.0 8.7 - 10.2 mg/dL   Total Protein 7.2 6.0 - 8.5 g/dL   Albumin 4.1 3.5 - 5.5 g/dL   Globulin, Total 3.1 1.5 - 4.5 g/dL   Albumin/Globulin Ratio 1.3 1.2 - 2.2   Bilirubin Total 0.5 0.0 - 1.2 mg/dL   Alkaline Phosphatase 93 39 - 117 IU/L   AST 21 0 - 40 IU/L   ALT 16 0 - 32 IU/L  Lipid Panel w/o Chol/HDL Ratio  Result Value Ref Range   Cholesterol, Total 167 100 - 199 mg/dL   Triglycerides 48 0 - 149 mg/dL   HDL 60 >39 mg/dL   VLDL Cholesterol Cal 10 5 - 40 mg/dL   LDL Calculated 97 0 - 99 mg/dL  Thyroid Panel With TSH  Result Value Ref Range   TSH 1.030 0.450 - 4.500 uIU/mL   T4, Total 7.2 4.5 - 12.0 ug/dL   T3 Uptake Ratio 24 24 - 39 %   Free Thyroxine Index 1.7 1.2 - 4.9  VITAMIN D 25 Hydroxy (Vit-D Deficiency, Fractures)  Result Value Ref Range   Vit D, 25-Hydroxy 15.6 (L) 30.0 - 100.0 ng/mL  UA/M w/rflx  Culture, Routine  Result Value Ref Range   Specific Gravity, UA 1.020 1.005 - 1.030   pH, UA 7.0 5.0 - 7.5   Color, UA Yellow Yellow   Appearance Ur Clear Clear   Leukocytes, UA Negative Negative   Protein, UA Negative Negative/Trace   Glucose, UA Negative Negative   Ketones, UA Negative Negative   RBC, UA Negative Negative   Bilirubin, UA Negative Negative  Urobilinogen, Ur 2.0 (H) 0.2 - 1.0 mg/dL   Nitrite, UA Negative Negative   Microscopic Examination See below:   Prolactin  Result Value Ref Range   Prolactin 5.5 4.8 - 23.3 ng/mL  H. pylori antibody, IgG  Result Value Ref Range   H. pylori, IgG AbS <0.80 0.00 - 0.79 Index Value  Lyme Ab/Western Blot Reflex  Result Value Ref Range   Lyme IgG/IgM Ab <0.91 0.00 - 0.90 ISR   LYME DISEASE AB, QUANT, IGM <0.80 0.00 - 0.79 index  Rocky mtn spotted fvr abs pnl(IgG+IgM)  Result Value Ref Range   RMSF IgG Negative Negative   RMSF IgM 0.44 0.00 - 6.30 index  Ehrlichia Antibody Panel  Result Value Ref Range   E.Chaffeensis (HME) IgG Negative Neg:<1:64   E. Chaffeensis (HME) IgM Titer Negative Neg:<1:20   HGE IgG Titer Negative Neg:<1:64   HGE IgM Titer Negative Neg:<1:20  Babesia microti Antibody Panel  Result Value Ref Range   Babesia microti IgM <1:10 Neg:<1:10   Babesia microti IgG <1:10 Neg:<1:10  CBC with Differential/Platelet  Result Value Ref Range   WBC 6.7 3.4 - 10.8 x10E3/uL   RBC 3.95 3.77 - 5.28 x10E6/uL   Hemoglobin 13.1 11.1 - 15.9 g/dL   Hematocrit 39.4 34.0 - 46.6 %   MCV 100 (H) 79 - 97 fL   MCH 33.2 (H) 26.6 - 33.0 pg   MCHC 33.2 31.5 - 35.7 g/dL   RDW 13.0 12.3 - 15.4 %   Platelets 375 150 - 379 x10E3/uL   Neutrophils 59 Not Estab. %   Lymphs 33 Not Estab. %   Monocytes 6 Not Estab. %   Eos 1 Not Estab. %   Basos 1 Not Estab. %   Neutrophils Absolute 3.9 1.4 - 7.0 x10E3/uL   Lymphocytes Absolute 2.2 0.7 - 3.1 x10E3/uL   Monocytes Absolute 0.4 0.1 - 0.9 x10E3/uL   EOS (ABSOLUTE) 0.1 0.0 - 0.4  x10E3/uL   Basophils Absolute 0.1 0.0 - 0.2 x10E3/uL   Immature Granulocytes 0 Not Estab. %   Immature Grans (Abs) 0.0 0.0 - 0.1 x10E3/uL      Assessment & Plan:   Problem List Items Addressed This Visit      Cardiovascular and Mediastinum   Migraine with aura and without status migrainosus, not intractable    Resolved. Feeling better. No concerns.         Digestive   Gastroesophageal reflux disease    Resolved. Feeling better. No concerns.         Other   Vitamin D deficiency - Primary    Doing better with supplementation. Call with any concerns.       Obesity (BMI 35.0-39.9 without comorbidity)    Will look over information on weight loss medicines. Will consider them and call. Continue diet and exercise.        Other Visit Diagnoses    Fatigue, unspecified type       Significantly better with vitamin D supplementation. Call with any concerns. Recheck 2 months.        Follow up plan: Return in about 2 months (around 12/17/2017) for Physical.

## 2017-10-17 NOTE — Assessment & Plan Note (Signed)
Doing better with supplementation. Call with any concerns.

## 2017-10-17 NOTE — Patient Instructions (Addendum)
Liraglutide injection (Weight Management) What is this medicine? LIRAGLUTIDE (LIR a GLOO tide) is used with a reduced calorie diet and exercise to help you lose weight. This medicine may be used for other purposes; ask your health care provider or pharmacist if you have questions. COMMON BRAND NAME(S): Saxenda What should I tell my health care provider before I take this medicine? They need to know if you have any of these conditions: -endocrine tumors (MEN 2) or if someone in your family had these tumors -gallbladder disease -high cholesterol -history of alcohol abuse problem -history of pancreatitis -kidney disease or if you are on dialysis -liver disease -previous swelling of the tongue, face, or lips with difficulty breathing, difficulty swallowing, hoarseness, or tightening of the throat -stomach problems -suicidal thoughts, plans, or attempt; a previous suicide attempt by you or a family member -thyroid cancer or if someone in your family had thyroid cancer -an unusual or allergic reaction to liraglutide, other medicines, foods, dyes, or preservatives -pregnant or trying to get pregnant -breast-feeding How should I use this medicine? This medicine is for injection under the skin of your upper leg, stomach area, or upper arm. You will be taught how to prepare and give this medicine. Use exactly as directed. Take your medicine at regular intervals. Do not take it more often than directed. It is important that you put your used needles and syringes in a special sharps container. Do not put them in a trash can. If you do not have a sharps container, call your pharmacist or healthcare provider to get one. A special MedGuide will be given to you by the pharmacist with each prescription and refill. Be sure to read this information carefully each time. Talk to your pediatrician regarding the use of this medicine in children. Special care may be needed. Overdosage: If you think you have taken  too much of this medicine contact a poison control center or emergency room at once. NOTE: This medicine is only for you. Do not share this medicine with others. What if I miss a dose? If you miss a dose, take it as soon as you can. If it is almost time for your next dose, take only that dose. Do not take double or extra doses. If you miss your dose for 3 days or more, call your doctor or health care professional to talk about how to restart this medicine. What may interact with this medicine? -insulin and other medicines for diabetes This list may not describe all possible interactions. Give your health care provider a list of all the medicines, herbs, non-prescription drugs, or dietary supplements you use. Also tell them if you smoke, drink alcohol, or use illegal drugs. Some items may interact with your medicine. What should I watch for while using this medicine? Visit your doctor or health care professional for regular checks on your progress. This medicine is intended to be used in addition to a healthy diet and appropriate exercise. The best results are achieved this way. Do not increase or in any way change your dose without consulting your doctor or health care professional. Drink plenty of fluids while taking this medicine. Check with your doctor or health care professional if you get an attack of severe diarrhea, nausea, and vomiting. The loss of too much body fluid can make it dangerous for you to take this medicine. This medicine may affect blood sugar levels. If you have diabetes, check with your doctor or health care professional before you change your diet  or the dose of your diabetic medicine. Patients and their families should watch out for worsening depression or thoughts of suicide. Also watch out for sudden changes in feelings such as feeling anxious, agitated, panicky, irritable, hostile, aggressive, impulsive, severely restless, overly excited and hyperactive, or not being able to  sleep. If this happens, especially at the beginning of treatment or after a change in dose, call your health care professional. What side effects may I notice from receiving this medicine? Side effects that you should report to your doctor or health care professional as soon as possible: -allergic reactions like skin rash, itching or hives, swelling of the face, lips, or tongue -breathing problems -diarrhea that continues or is severe -lump or swelling on the neck -severe nausea -signs and symptoms of infection like fever or chills; cough; sore throat; pain or trouble passing urine -signs and symptoms of low blood sugar such as feeling anxious, confusion, dizziness, increased hunger, unusually weak or tired, sweating, shakiness, cold, irritable, headache, blurred vision, fast heartbeat, loss of consciousness -signs and symptoms of kidney injury like trouble passing urine or change in the amount of urine -trouble swallowing -unusual stomach upset or pain -vomiting Side effects that usually do not require medical attention (report to your doctor or health care professional if they continue or are bothersome): -constipation -decreased appetite -diarrhea -fatigue -headache -nausea -pain, redness, or irritation at site where injected -stomach upset -stuffy or runny nose This list may not describe all possible side effects. Call your doctor for medical advice about side effects. You may report side effects to FDA at 1-800-FDA-1088. Where should I keep my medicine? Keep out of the reach of children. Store unopened pen in a refrigerator between 2 and 8 degrees C (36 and 46 degrees F). Do not freeze or use if the medicine has been frozen. Protect from light and excessive heat. After you first use the pen, it can be stored at room temperature between 15 and 30 degrees C (59 and 86 degrees F) or in a refrigerator. Throw away your used pen after 30 days or after the expiration date, whichever comes  first. Do not store your pen with the needle attached. If the needle is left on, medicine may leak from the pen. NOTE: This sheet is a summary. It may not cover all possible information. If you have questions about this medicine, talk to your doctor, pharmacist, or health care provider.  2018 Else[employer]/Gold Standard (2016-12-22 14:41:37) Lorcaserin oral tablets What is this medicine? LORCASERIN (lor ca SER in) is used to promote and maintain weight loss in obese patients. This medicine should be used with a reduced calorie diet and, if appropriate, an exercise program. This medicine may be used for other purposes; ask your health care provider or pharmacist if you have questions. COMMON BRAND NAME(S): Belviq What should I tell my health care provider before I take this medicine? They need to know if you have any of these conditions: -anatomical deformation of the penis, Peyronie's disease, or history of priapism (painful and prolonged erection) -diabetes -heart disease -history of blood diseases, like sickle cell anemia or leukemia -history of irregular heartbeat -kidney disease -liver disease -suicidal thoughts, plans, or attempt; a previous suicide attempt by you or a family member -an unusual or allergic reaction to lorcaserin, other medicines, foods, dyes, or preservatives -pregnant or trying to get pregnant -breast-feeding How should I use this medicine? Take this medicine by mouth with a glass of water. Follow the directions on the  prescription label. You can take it with or without food. Take your medicine at regular intervals. Do not take it more often than directed. Do not stop taking except on your doctor's advice. Talk to your pediatrician regarding the use of this medicine in children. Special care may be needed. Overdosage: If you think you have taken too much of this medicine contact a poison control center or emergency room at once. NOTE: This medicine is only for you. Do not  share this medicine with others. What if I miss a dose? If you miss a dose, take it as soon as you can. If it is almost time for your next dose, take only that dose. Do not take double or extra doses. What may interact with this medicine? -cabergoline -certain medicines for depression, anxiety, or psychotic disturbances -certain medicines for erectile dysfunction -certain medicines for migraine headache like almotriptan, eletriptan, frovatriptan, naratriptan, rizatriptan, sumatriptan, zolmitriptan -dextromethorphan -linezolid -lithium -medicines for diabetes -other weight loss products -tramadol -St. John's Wort -stimulant medicines for attention disorders, weight loss, or to stay awake -tryptophan This list may not describe all possible interactions. Give your health care provider a list of all the medicines, herbs, non-prescription drugs, or dietary supplements you use. Also tell them if you smoke, drink alcohol, or use illegal drugs. Some items may interact with your medicine. What should I watch for while using this medicine? This medicine is intended to be used in addition to a healthy diet and appropriate exercise. The best results are achieved this way. Your doctor should instruct you to stop taking this medicine if you do not lose a certain amount of weight within the first 12 weeks of treatment, but it is important that you do not change your dose in any way without consulting your doctor or health care professional. Visit your doctor or health care professional for regular checkups. Your doctor may order blood tests or other tests to see how you are doing. Do not drive, use machinery, or do anything that needs mental alertness until you know how this medicine affects you. This medicine may affect blood sugar levels. If you have diabetes, check with your doctor or health care professional before you change your diet or the dose of your diabetic medicine. Patients and their families  should watch out for worsening depression or thoughts of suicide. Also watch out for sudden changes in feelings such as feeling anxious, agitated, panicky, irritable, hostile, aggressive, impulsive, severely restless, overly excited and hyperactive, or not being able to sleep. If this happens, especially at the beginning of treatment or after a change in dose, call your health care professional. Contact your doctor or health care professional right away if you are a man with an erection that lasts longer than 4 hours or if the erection becomes painful. This may be a sign of serious problem and must be treated right away to prevent permanent damage. What side effects may I notice from receiving this medicine? Side effects that you should report to your doctor or health care professional as soon as possible: -allergic reactions like skin rash, itching or hives, swelling of the face, lips, or tongue -abnormal production of milk -breast enlargement in both males and females -breathing problems -changes in emotions or moods -changes in vision -confusion -erection lasting more than 4 hours or a painful erection -fast or irregular heart beat -feeling faint or lightheaded, falls -fever or chills, sore throat -hallucination, loss of contact with reality -high or low blood pressure -menstrual  changes -restlessness -slow or irregular heartbeat -stiff muscles -sweating -suicidal thoughts or other mood changes -swelling of the ankles, feet, hands -unusually weak or tired -vomiting Side effects that usually do not require medical attention (report to your doctor or health care professional if they continue or are bothersome): -back pain -constipation -cough -dry mouth -nausea -tiredness This list may not describe all possible side effects. Call your doctor for medical advice about side effects. You may report side effects to FDA at 1-800-FDA-1088. Where should I keep my medicine? Keep out of the  reach of children. This medicine can be abused. Keep your medicine in a safe place to protect it from theft. Do not share this medicine with anyone. Selling or giving away this medicine is dangerous and against the law. Store at room temperature between 15 and 30 degrees C (59 and 86 degrees F). Throw away any unused medicine after the expiration date. NOTE: This sheet is a summary. It may not cover all possible information. If you have questions about this medicine, talk to your doctor, pharmacist, or health care provider.  2018 [employer]/Gold Standard (2016-01-07 12:13:31) Bupropion; Naltrexone extended-release tablets What is this medicine? BUPROPION; NALTREXONE (byoo PROE pee on; nal TREX one) is a combination product used to promote and maintain weight loss in obese adults or overweight adults who also have weight related medical problems. This medicine should be used with a reduced calorie diet and increased physical activity. This medicine may be used for other purposes; ask your health care provider or pharmacist if you have questions. COMMON BRAND NAME(S): CONTRAVE What should I tell my health care provider before I take this medicine? They need to know if you have any of these conditions: -an eating disorder, such as anorexia or bulimia -bipolar disorder -diabetes -depression -drug abuse or addiction -glaucoma -head injury -heart disease -high blood pressure -history of a tumor or infection of your brain or spine -history of stroke -history of irregular heartbeat -if you often drink alcohol -kidney disease -liver disease -schizophrenia -seizures -suicidal thoughts, plans, or attempt; a previous suicide attempt by you or a family member -an unusual or allergic reaction to bupropion, naltrexone, other medicines, foods, dyes, or preservatives -breast-feeding -pregnant or trying to become pregnant How should I use this medicine? Take this medicine by mouth with a glass of  water. Follow the directions on the prescription label. Take this medicine in the morning and in the evenings as directed by your healthcare professional. Dennis Bast can take it with or without food. Do not take with high-fat meals as this may increase your risk of seizures. Do not crush, chew, or cut these tablets. Do not take your medicine more often than directed. Do not stop taking this medicine suddenly except upon the advice of your doctor. A special MedGuide will be given to you by the pharmacist with each prescription and refill. Be sure to read this information carefully each time. Talk to your pediatrician regarding the use of this medicine in children. Special care may be needed. Overdosage: If you think you have taken too much of this medicine contact a poison control center or emergency room at once. NOTE: This medicine is only for you. Do not share this medicine with others. What if I miss a dose? If you miss a dose, skip the missed dose and take your next tablet at the regular time. Do not take double or extra doses. What may interact with this medicine? Do not take this medicine with any of  the following medications: -any prescription or street opioid drug like codeine, heroin, methadone -linezolid -MAOIs like Carbex, Eldepryl, Marplan, Nardil, and Parnate -methylene blue (injected into a vein) -other medicines that contain bupropion like Zyban or Wellbutrin This medicine may also interact with the following medications: -alcohol -certain medicines for anxiety or sleep -certain medicines for blood pressure like metoprolol, propranolol -certain medicines for depression or psychotic disturbances -certain medicines for HIV or AIDS like efavirenz, lopinavir, nelfinavir, ritonavir -certain medicines for irregular heart beat like propafenone, flecainide -certain medicines for Parkinson's disease like amantadine, levodopa -certain medicines for seizures like carbamazepine, phenytoin,  phenobarbital -cimetidine -clopidogrel -cyclophosphamide -digoxin -disulfiram -furazolidone -isoniazid -nicotine -orphenadrine -procarbazine -steroid medicines like prednisone or cortisone -stimulant medicines for attention disorders, weight loss, or to stay awake -tamoxifen -theophylline -thioridazine -thiotepa -ticlopidine -tramadol -warfarin This list may not describe all possible interactions. Give your health care provider a list of all the medicines, herbs, non-prescription drugs, or dietary supplements you use. Also tell them if you smoke, drink alcohol, or use illegal drugs. Some items may interact with your medicine. What should I watch for while using this medicine? This medicine is intended to be used in addition to a healthy diet and appropriate exercise. The best results are achieved this way. Do not increase or in any way change your dose without consulting your doctor or health care professional. Do not take this medicine with other prescription or over-the-counter weight loss products without consulting your doctor or health care professional. Your doctor should tell you to stop taking this medicine if you do not lose a certain amount of weight within the first 12 weeks of treatment. Visit your doctor or health care professional for regular checkups. Your doctor may order blood tests or other tests to see how you are doing. This medicine may affect blood sugar levels. If you have diabetes, check with your doctor or health care professional before you change your diet or the dose of your diabetic medicine. Patients and their families should watch out for new or worsening depression or thoughts of suicide. Also watch out for sudden changes in feelings such as feeling anxious, agitated, panicky, irritable, hostile, aggressive, impulsive, severely restless, overly excited and hyperactive, or not being able to sleep. If this happens, especially at the beginning of treatment or  after a change in dose, call your health care professional. Avoid alcoholic drinks while taking this medicine. Drinking large amounts of alcoholic beverages, using sleeping or anxiety medicines, or quickly stopping the use of these agents while taking this medicine may increase your risk for a seizure. What side effects may I notice from receiving this medicine? Side effects that you should report to your doctor or health care professional as soon as possible: -allergic reactions like skin rash, itching or hives, swelling of the face, lips, or tongue -breathing problems -changes in vision -confusion -elevated mood, decreased need for sleep, racing thoughts, impulsive behavior -fast or irregular heartbeat -hallucinations, loss of contact with reality -increased blood pressure -redness, blistering, peeling or loosening of the skin, including inside the mouth -seizures -signs and symptoms of liver injury like dark yellow or brown urine; general ill feeling or flu-like symptoms; light-colored stools; loss of appetite; nausea; right upper belly pain; unusually weak or tired; yellowing of the eyes or skin -suicidal thoughts or other mood changes -vomiting Side effects that usually do not require medical attention (report to your doctor or health care professional if they continue or are bothersome): -constipation -headache -loss of appetite -  indigestion, stomach upset -tremors This list may not describe all possible side effects. Call your doctor for medical advice about side effects. You may report side effects to FDA at 1-800-FDA-1088. Where should I keep my medicine? Keep out of the reach of children. Store at room temperature between 15 and 30 degrees C (59 and 86 degrees F). Throw away any unused medicine after the expiration date. NOTE: This sheet is a summary. It may not cover all possible information. If you have questions about this medicine, talk to your doctor, pharmacist, or health  care provider.  2018 [employer] Standard (2016-05-27 13:42:58)

## 2017-10-17 NOTE — Assessment & Plan Note (Signed)
Will look over information on weight loss medicines. Will consider them and call. Continue diet and exercise.

## 2017-12-11 LAB — UA/M W/RFLX CULTURE, ROUTINE
Bilirubin, UA: NEGATIVE
GLUCOSE, UA: NEGATIVE
Ketones, UA: NEGATIVE
LEUKOCYTES UA: NEGATIVE
Nitrite, UA: NEGATIVE
PH UA: 7 (ref 5.0–7.5)
PROTEIN UA: NEGATIVE
RBC, UA: NEGATIVE
Specific Gravity, UA: 1.02 (ref 1.005–1.030)
Urobilinogen, Ur: 2 mg/dL — ABNORMAL HIGH (ref 0.2–1.0)

## 2017-12-11 LAB — MICROSCOPIC EXAMINATION
Bacteria, UA: NONE SEEN
RBC MICROSCOPIC, UA: NONE SEEN /HPF (ref 0–?)

## 2017-12-18 ENCOUNTER — Ambulatory Visit (INDEPENDENT_AMBULATORY_CARE_PROVIDER_SITE_OTHER): Payer: PRIVATE HEALTH INSURANCE | Admitting: Family Medicine

## 2017-12-18 ENCOUNTER — Encounter: Payer: Self-pay | Admitting: Family Medicine

## 2017-12-18 VITALS — BP 117/78 | HR 71 | Temp 98.7°F | Ht 63.5 in | Wt 217.4 lb

## 2017-12-18 DIAGNOSIS — E559 Vitamin D deficiency, unspecified: Secondary | ICD-10-CM

## 2017-12-18 DIAGNOSIS — G43109 Migraine with aura, not intractable, without status migrainosus: Secondary | ICD-10-CM | POA: Diagnosis not present

## 2017-12-18 DIAGNOSIS — Z0000 Encounter for general adult medical examination without abnormal findings: Secondary | ICD-10-CM

## 2017-12-18 DIAGNOSIS — K5909 Other constipation: Secondary | ICD-10-CM | POA: Diagnosis not present

## 2017-12-18 DIAGNOSIS — Z0001 Encounter for general adult medical examination with abnormal findings: Secondary | ICD-10-CM | POA: Diagnosis not present

## 2017-12-18 DIAGNOSIS — Z13220 Encounter for screening for lipoid disorders: Secondary | ICD-10-CM

## 2017-12-18 DIAGNOSIS — Z124 Encounter for screening for malignant neoplasm of cervix: Secondary | ICD-10-CM

## 2017-12-18 LAB — UA/M W/RFLX CULTURE, ROUTINE
Bilirubin, UA: NEGATIVE
GLUCOSE, UA: NEGATIVE
KETONES UA: NEGATIVE
NITRITE UA: NEGATIVE
PROTEIN UA: NEGATIVE
SPEC GRAV UA: 1.02 (ref 1.005–1.030)
UUROB: 1 mg/dL (ref 0.2–1.0)
pH, UA: 5.5 (ref 5.0–7.5)

## 2017-12-18 LAB — MICROSCOPIC EXAMINATION: BACTERIA UA: NONE SEEN

## 2017-12-18 MED ORDER — RIZATRIPTAN BENZOATE 5 MG PO TABS: 5.0000 mg | ORAL_TABLET | ORAL | 12 refills | Status: DC | PRN

## 2017-12-18 MED ORDER — LUBIPROSTONE 24 MCG PO CAPS: 24.0000 ug | ORAL_CAPSULE | Freq: Two times a day (BID) | ORAL | 6 refills | Status: DC

## 2017-12-18 NOTE — Progress Notes (Signed)
BP 117/78 (BP Location: Left Arm, Patient Position: Sitting, Cuff Size: Large)   Pulse 71   Temp 98.7 F (37.1 C)   Ht 5' 3.5" (1.613 m)   Wt 217 lb 7 oz (98.6 kg)   SpO2 100%   BMI 37.91 kg/m    Subjective:    Patient ID: Bailey Perry, female    DOB: 1976/08/04, 41 y.o.   MRN: [id number]  HPI: Bailey Perry is a 41 y.o. female presenting on 12/18/2017 for comprehensive medical examination. Current medical complaints include:  Has been having some headaches again.  Has been very constipated for years. Takes laxatives and stool softeners, but only goes less than 1x a day. Never has any diarrhea. No pain in her belly. Has done epsom salt enemas to get herself to go. Not happy with this, and would like to take something to make her more regular.   She currently lives with: husband and kids Menopausal Symptoms: no  Depression Screen done today and results listed below:  Depression screen [facility] 2/9 10/17/2017 09/19/2017 10/26/2015  Decreased Interest 0 2 0  Down, Depressed, Hopeless 0 3 0  PHQ - 2 Score 0 5 0  Altered sleeping 0 3 -  Tired, decreased energy 0 3 -  Change in appetite 0 0 -  Feeling bad or failure about yourself  0 1 -  Trouble concentrating 0 0 -  Moving slowly or fidgety/restless 0 0 -  Suicidal thoughts 0 0 -  PHQ-9 Score 0 12 -    Past Medical History:  Past Medical History:  Diagnosis Date  . Abnormal Pap smear of cervix     Surgical History:  Past Surgical History:  Procedure Laterality Date  . APPENDECTOMY    . CESAREAN SECTION     X 2    Medications:  Current Outpatient Medications on File Prior to Visit  Medication Sig  . levonorgestrel (MIRENA) 20 MCG/24HR IUD 1 Intra Uterine Device (1 each total) by Intrauterine route once.   No current facility-administered medications on file prior to visit.     Allergies:  Allergies  Allergen Reactions  . Iodine Itching  . Shellfish Allergy Rash    Social History:  Social History    Socioeconomic History  . Marital status: Married    Spouse name: Not on file  . Number of children: Not on file  . Years of education: Not on file  . Highest education level: Not on file  Social Needs  . Financial resource strain: Not on file  . Food insecurity - worry: Not on file  . Food insecurity - inability: Not on file  . Transportation needs - medical: Not on file  . Transportation needs - non-medical: Not on file  Occupational History  . Not on file  Tobacco Use  . Smoking status: Never Smoker  . Smokeless tobacco: Never Used  Substance and Sexual Activity  . Alcohol use: No  . Drug use: No  . Sexual activity: Yes    Birth control/protection: IUD  Other Topics Concern  . Not on file  Social History Narrative  . Not on file   Social History   Tobacco Use  Smoking Status Never Smoker  Smokeless Tobacco Never Used   Social History   Substance and Sexual Activity  Alcohol Use No    Family History:  Family History  Problem Relation Age of Onset  . Diabetes Mother   . Heart disease Mother   . Hypertension Mother   .  Cancer Father        Lymphoma  . Arthritis Paternal Grandfather     Past medical history, surgical history, medications, allergies, family history and social history reviewed with patient today and changes made to appropriate areas of the chart.   Review of Systems  Constitutional: Negative.   HENT: Negative.   Eyes: Negative.   Respiratory: Negative.   Cardiovascular: Negative.   Gastrointestinal: Positive for constipation. Negative for abdominal pain, blood in stool, diarrhea, heartburn, melena, nausea and vomiting.  Genitourinary: Negative.   Musculoskeletal: Negative.   Skin: Negative.   Neurological: Positive for headaches. Negative for dizziness, tingling, tremors, sensory change, speech change, focal weakness, seizures and loss of consciousness.  Endo/Heme/Allergies: Negative.   Psychiatric/Behavioral: Negative.     All other  ROS negative except what is listed above and in the HPI.      Objective:    BP 117/78 (BP Location: Left Arm, Patient Position: Sitting, Cuff Size: Large)   Pulse 71   Temp 98.7 F (37.1 C)   Ht 5' 3.5" (1.613 m)   Wt 217 lb 7 oz (98.6 kg)   SpO2 100%   BMI 37.91 kg/m   Wt Readings from Last 3 Encounters:  12/18/17 217 lb 7 oz (98.6 kg)  10/17/17 215 lb (97.5 kg)  09/19/17 213 lb 6 oz (96.8 kg)    Physical Exam  Constitutional: She is oriented to person, place, and time. She appears well-developed and well-nourished. No distress.  HENT:  Head: Normocephalic and atraumatic.  Right Ear: Hearing, tympanic membrane, external ear and ear canal normal.  Left Ear: Hearing, tympanic membrane, external ear and ear canal normal.  Nose: Nose normal.  Mouth/Throat: Uvula is midline, oropharynx is clear and moist and mucous membranes are normal. No oropharyngeal exudate.  Eyes: Conjunctivae, EOM and lids are normal. Pupils are equal, round, and reactive to light. Right eye exhibits no discharge. Left eye exhibits no discharge. No scleral icterus.  Neck: Normal range of motion. Neck supple. No JVD present. No tracheal deviation present. No thyromegaly present.  Cardiovascular: Normal rate, regular rhythm, normal heart sounds and intact distal pulses. Exam reveals no gallop and no friction rub.  No murmur heard. Pulmonary/Chest: Effort normal and breath sounds normal. No stridor. No respiratory distress. She has no wheezes. She has no rales. She exhibits no tenderness. Right breast exhibits no inverted nipple, no mass, no nipple discharge, no skin change and no tenderness. Left breast exhibits no inverted nipple, no mass, no nipple discharge, no skin change and no tenderness. Breasts are symmetrical.  Abdominal: Soft. Bowel sounds are normal. She exhibits no distension and no mass. There is no tenderness. There is no rebound and no guarding. Hernia confirmed negative in the right inguinal area and  confirmed negative in the left inguinal area.  Genitourinary: Vagina normal and uterus normal. No labial fusion. There is no rash, tenderness, lesion or injury on the right labia. There is no rash, tenderness, lesion or injury on the left labia. Uterus is not deviated, not enlarged, not fixed and not tender. Cervix exhibits no motion tenderness, no discharge and no friability. Right adnexum displays no mass, no tenderness and no fullness. Left adnexum displays no mass, no tenderness and no fullness. No erythema, tenderness or bleeding in the vagina. No foreign body in the vagina. No signs of injury around the vagina. No vaginal discharge found.  Musculoskeletal: Normal range of motion. She exhibits no edema, tenderness or deformity.  Lymphadenopathy:  She has no cervical adenopathy.       Right: No inguinal adenopathy present.       Left: No inguinal adenopathy present.  Neurological: She is alert and oriented to person, place, and time. She has normal reflexes. She displays normal reflexes. No cranial nerve deficit. She exhibits normal muscle tone. Coordination normal.  Skin: Skin is warm, dry and intact. No rash noted. She is not diaphoretic. No erythema. No pallor.  Psychiatric: She has a normal mood and affect. Her speech is normal and behavior is normal. Judgment and thought content normal. Cognition and memory are normal.  Nursing note and vitals reviewed.   Results for orders placed or performed in visit on 09/19/17  Microscopic Examination  Result Value Ref Range   WBC, UA 0-5 0 - 5 /hpf   RBC, UA None seen 0 - 2 /hpf   Epithelial Cells (non renal) 0-10 0 - 10 /hpf   Bacteria, UA None seen None seen/Few  Comprehensive metabolic panel  Result Value Ref Range   Glucose 81 65 - 99 mg/dL   BUN 9 6 - 24 mg/dL   Creatinine, Ser 0.88 0.57 - 1.00 mg/dL   GFR calc non Af Amer 81 >59 mL/min/1.73   GFR calc Af Amer 93 >59 mL/min/1.73   BUN/Creatinine Ratio 10 9 - 23   Sodium 142 134 - 144  mmol/L   Potassium 4.0 3.5 - 5.2 mmol/L   Chloride 105 96 - 106 mmol/L   CO2 24 20 - 29 mmol/L   Calcium 9.0 8.7 - 10.2 mg/dL   Total Protein 7.2 6.0 - 8.5 g/dL   Albumin 4.1 3.5 - 5.5 g/dL   Globulin, Total 3.1 1.5 - 4.5 g/dL   Albumin/Globulin Ratio 1.3 1.2 - 2.2   Bilirubin Total 0.5 0.0 - 1.2 mg/dL   Alkaline Phosphatase 93 39 - 117 IU/L   AST 21 0 - 40 IU/L   ALT 16 0 - 32 IU/L  Lipid Panel w/o Chol/HDL Ratio  Result Value Ref Range   Cholesterol, Total 167 100 - 199 mg/dL   Triglycerides 48 0 - 149 mg/dL   HDL 60 >39 mg/dL   VLDL Cholesterol Cal 10 5 - 40 mg/dL   LDL Calculated 97 0 - 99 mg/dL  Thyroid Panel With TSH  Result Value Ref Range   TSH 1.030 0.450 - 4.500 uIU/mL   T4, Total 7.2 4.5 - 12.0 ug/dL   T3 Uptake Ratio 24 24 - 39 %   Free Thyroxine Index 1.7 1.2 - 4.9  VITAMIN D 25 Hydroxy (Vit-D Deficiency, Fractures)  Result Value Ref Range   Vit D, 25-Hydroxy 15.6 (L) 30.0 - 100.0 ng/mL  UA/M w/rflx Culture, Routine  Result Value Ref Range   Specific Gravity, UA 1.020 1.005 - 1.030   pH, UA 7.0 5.0 - 7.5   Color, UA Yellow Yellow   Appearance Ur Clear Clear   Leukocytes, UA Negative Negative   Protein, UA Negative Negative/Trace   Glucose, UA Negative Negative   Ketones, UA Negative Negative   RBC, UA Negative Negative   Bilirubin, UA Negative Negative   Urobilinogen, Ur 2.0 (H) 0.2 - 1.0 mg/dL   Nitrite, UA Negative Negative   Microscopic Examination See below:   Prolactin  Result Value Ref Range   Prolactin 5.5 4.8 - 23.3 ng/mL  H. pylori antibody, IgG  Result Value Ref Range   H. pylori, IgG AbS <0.80 0.00 - 0.79 Index Value  Lyme Ab/Western Blot  Reflex  Result Value Ref Range   Lyme IgG/IgM Ab <0.91 0.00 - 0.90 ISR   LYME DISEASE AB, QUANT, IGM <0.80 0.00 - 0.79 index  Rocky mtn spotted fvr abs pnl(IgG+IgM)  Result Value Ref Range   RMSF IgG Negative Negative   RMSF IgM 0.44 0.00 - 7.16 index  Ehrlichia Antibody Panel  Result Value Ref Range    E.Chaffeensis (HME) IgG Negative Neg:<1:64   E. Chaffeensis (HME) IgM Titer Negative Neg:<1:20   HGE IgG Titer Negative Neg:<1:64   HGE IgM Titer Negative Neg:<1:20  Babesia microti Antibody Panel  Result Value Ref Range   Babesia microti IgM <1:10 Neg:<1:10   Babesia microti IgG <1:10 Neg:<1:10  CBC with Differential/Platelet  Result Value Ref Range   WBC 6.7 3.4 - 10.8 x10E3/uL   RBC 3.95 3.77 - 5.28 x10E6/uL   Hemoglobin 13.1 11.1 - 15.9 g/dL   Hematocrit 39.4 34.0 - 46.6 %   MCV 100 (H) 79 - 97 fL   MCH 33.2 (H) 26.6 - 33.0 pg   MCHC 33.2 31.5 - 35.7 g/dL   RDW 13.0 12.3 - 15.4 %   Platelets 375 150 - 379 x10E3/uL   Neutrophils 59 Not Estab. %   Lymphs 33 Not Estab. %   Monocytes 6 Not Estab. %   Eos 1 Not Estab. %   Basos 1 Not Estab. %   Neutrophils Absolute 3.9 1.4 - 7.0 x10E3/uL   Lymphocytes Absolute 2.2 0.7 - 3.1 x10E3/uL   Monocytes Absolute 0.4 0.1 - 0.9 x10E3/uL   EOS (ABSOLUTE) 0.1 0.0 - 0.4 x10E3/uL   Basophils Absolute 0.1 0.0 - 0.2 x10E3/uL   Immature Granulocytes 0 Not Estab. %   Immature Grans (Abs) 0.0 0.0 - 0.1 x10E3/uL      Assessment & Plan:   Problem List Items Addressed This Visit      Cardiovascular and Mediastinum   Migraine with aura and without status migrainosus, not intractable    Starting having bad headaches again. Did well with the imitrex but it gave her severe fatigue afterwards. Will try maxalt. Call with any concerns.       Relevant Medications   rizatriptan (MAXALT) 5 MG tablet     Digestive   Chronic constipation    Has tried stool softeners, enemas, laxatives OTC without benefit. Will start amitiza. Call with any concerns.         Other   Vitamin D deficiency    Rechecking levels today. Await results.       Relevant Orders   VITAMIN D 25 Hydroxy (Vit-D Deficiency, Fractures)    Other Visit Diagnoses    Routine general medical examination at a health care facility    -  Primary   Vaccines up to date. Screening  labs checked today. Pap done. Continue diet and exercise. Call with any concerns.    Relevant Orders   CBC with Differential/Platelet   Comprehensive metabolic panel   Lipid Panel w/o Chol/HDL Ratio   TSH   UA/M w/rflx Culture, Routine   Screening for cholesterol level       Rechecking levels today. Await results.    Relevant Orders   Lipid Panel w/o Chol/HDL Ratio   Screening for cervical cancer       Pap done today. Await results.   Relevant Orders   IGP, Aptima HPV, rfx 16/18,45       Follow up plan: Return in about 4 weeks (around 01/15/2018) for follow up constipation and migraines.  LABORATORY TESTING:  - Pap smear: pap done  IMMUNIZATIONS:   - Tdap: Tetanus vaccination status reviewed: last tetanus booster within 10 years. - Influenza: Up to date - Pneumovax: Not applicable  SCREENING: -Mammogram: ordered- not done. Reminded patient to go  - Colonoscopy: Not applicable   PATIENT COUNSELING:   Advised to take 1 mg of folate supplement per day if capable of pregnancy.   Sexuality: Discussed sexually transmitted diseases, partner selection, use of condoms, avoidance of unintended pregnancy  and contraceptive alternatives.   Advised to avoid cigarette smoking.  I discussed with the patient that most people either abstain from alcohol or drink within safe limits (<=14/week and <=4 drinks/occasion for males, <=7/weeks and <= 3 drinks/occasion for females) and that the risk for alcohol disorders and other health effects rises proportionally with the number of drinks per week and how often a drinker exceeds daily limits.  Discussed cessation/primary prevention of drug use and availability of treatment for abuse.   Diet: Encouraged to adjust caloric intake to maintain  or achieve ideal body weight, to reduce intake of dietary saturated fat and total fat, to limit sodium intake by avoiding high sodium foods and not adding table salt, and to maintain adequate dietary  potassium and calcium preferably from fresh fruits, vegetables, and low-fat dairy products.    stressed the importance of regular exercise  Injury prevention: Discussed safety belts, safety helmets, smoke detector, smoking near bedding or upholstery.   Dental health: Discussed importance of regular tooth brushing, flossing, and dental visits.    NEXT PREVENTATIVE PHYSICAL DUE IN 1 YEAR. Return in about 4 weeks (around 01/15/2018) for follow up constipation and migraines.

## 2017-12-18 NOTE — Assessment & Plan Note (Signed)
Starting having bad headaches again. Did well with the imitrex but it gave her severe fatigue afterwards. Will try maxalt. Call with any concerns.

## 2017-12-18 NOTE — Assessment & Plan Note (Signed)
Rechecking levels today. Await results.  

## 2017-12-18 NOTE — Patient Instructions (Addendum)

## 2017-12-18 NOTE — Assessment & Plan Note (Signed)
Has tried stool softeners, enemas, laxatives OTC without benefit. Will start amitiza. Call with any concerns.

## 2017-12-19 LAB — CBC WITH DIFFERENTIAL/PLATELET
BASOS ABS: 0.1 10*3/uL (ref 0.0–0.2)
Basos: 1 %
EOS (ABSOLUTE): 0 10*3/uL (ref 0.0–0.4)
Eos: 0 %
HEMOGLOBIN: 13.5 g/dL (ref 11.1–15.9)
Hematocrit: 40.8 % (ref 34.0–46.6)
IMMATURE GRANS (ABS): 0 10*3/uL (ref 0.0–0.1)
IMMATURE GRANULOCYTES: 0 %
LYMPHS: 30 %
Lymphocytes Absolute: 2 10*3/uL (ref 0.7–3.1)
MCH: 32.1 pg (ref 26.6–33.0)
MCHC: 33.1 g/dL (ref 31.5–35.7)
MCV: 97 fL (ref 79–97)
MONOCYTES: 7 %
Monocytes Absolute: 0.5 10*3/uL (ref 0.1–0.9)
NEUTROS ABS: 4.2 10*3/uL (ref 1.4–7.0)
NEUTROS PCT: 62 %
PLATELETS: 383 10*3/uL — AB (ref 150–379)
RBC: 4.2 x10E6/uL (ref 3.77–5.28)
RDW: 13 % (ref 12.3–15.4)
WBC: 6.8 10*3/uL (ref 3.4–10.8)

## 2017-12-19 LAB — COMPREHENSIVE METABOLIC PANEL
ALBUMIN: 4.4 g/dL (ref 3.5–5.5)
ALT: 16 IU/L (ref 0–32)
AST: 26 IU/L (ref 0–40)
Albumin/Globulin Ratio: 1.4 (ref 1.2–2.2)
Alkaline Phosphatase: 99 IU/L (ref 39–117)
BUN / CREAT RATIO: 12 (ref 9–23)
BUN: 10 mg/dL (ref 6–24)
Bilirubin Total: 0.3 mg/dL (ref 0.0–1.2)
CALCIUM: 9.1 mg/dL (ref 8.7–10.2)
CHLORIDE: 101 mmol/L (ref 96–106)
CO2: 21 mmol/L (ref 20–29)
CREATININE: 0.84 mg/dL (ref 0.57–1.00)
GFR, EST AFRICAN AMERICAN: 98 mL/min/{1.73_m2} (ref >59)
GFR, EST NON AFRICAN AMERICAN: 85 mL/min/{1.73_m2} (ref >59)
GLUCOSE: 79 mg/dL (ref 65–99)
Globulin, Total: 3.2 g/dL (ref 1.5–4.5)
Potassium: 3.8 mmol/L (ref 3.5–5.2)
Sodium: 141 mmol/L (ref 134–144)
TOTAL PROTEIN: 7.6 g/dL (ref 6.0–8.5)

## 2017-12-19 LAB — TSH: TSH: 1.1 u[IU]/mL (ref 0.450–4.500)

## 2017-12-19 LAB — LIPID PANEL W/O CHOL/HDL RATIO
Cholesterol, Total: 173 mg/dL (ref 100–199)
HDL: 68 mg/dL (ref >39)
LDL CALC: 96 mg/dL (ref 0–99)
Triglycerides: 44 mg/dL (ref 0–149)
VLDL CHOLESTEROL CAL: 9 mg/dL (ref 5–40)

## 2017-12-19 LAB — VITAMIN D 25 HYDROXY (VIT D DEFICIENCY, FRACTURES): VIT D 25 HYDROXY: 31.1 ng/mL (ref 30.0–100.0)

## 2017-12-20 ENCOUNTER — Encounter: Payer: Self-pay | Admitting: Family Medicine

## 2017-12-22 LAB — IGP, APTIMA HPV, RFX 16/18,45
HPV APTIMA: NEGATIVE
PAP Smear Comment: 0

## 2018-01-06 ENCOUNTER — Other Ambulatory Visit: Payer: Self-pay | Admitting: Family Medicine

## 2018-01-08 NOTE — Telephone Encounter (Signed)
Medication request.

## 2018-07-06 ENCOUNTER — Encounter: Payer: Self-pay | Admitting: Family Medicine

## 2018-07-06 ENCOUNTER — Ambulatory Visit: Payer: No Typology Code available for payment source | Admitting: Family Medicine

## 2018-07-06 VITALS — BP 126/85 | HR 66 | Temp 98.4°F | Wt 222.1 lb

## 2018-07-06 DIAGNOSIS — M7711 Lateral epicondylitis, right elbow: Secondary | ICD-10-CM

## 2018-07-06 MED ORDER — NAPROXEN 500 MG PO TABS: 500.0000 mg | ORAL_TABLET | Freq: Two times a day (BID) | ORAL | 0 refills | Status: DC

## 2018-07-06 MED ORDER — PREDNISONE 50 MG PO TABS: 50.0000 mg | ORAL_TABLET | Freq: Every day | ORAL | 0 refills | Status: DC

## 2018-07-06 MED ORDER — CYCLOBENZAPRINE HCL 10 MG PO TABS: 10.0000 mg | ORAL_TABLET | Freq: Every day | ORAL | 0 refills | Status: DC

## 2018-07-06 NOTE — Patient Instructions (Signed)
Tennis Elbow Tennis elbow is puffiness (inflammation) of the outer tendons of your forearm close to your elbow. Your tendons attach your muscles to your bones. Tennis elbow can happen in any sport or job in which you use your elbow too much. It is caused by doing the same motion over and over. Tennis elbow can cause:  Pain and tenderness in your forearm and the outer part of your elbow.  A burning feeling. This runs from your elbow through your arm.  Weak grip in your hands.  Follow these instructions at home: Activity  Rest your elbow and wrist as told by your doctor. Try to avoid any activities that caused the problem until your doctor says that you can do them again.  If a physical therapist teaches you exercises, do all of them as told.  If you lift an object, lift it with your palm facing up. This is easier on your elbow. Lifestyle  If your tennis elbow is caused by sports, check your equipment and make sure that: ? You are using it correctly. ? It fits you well.  If your tennis elbow is caused by work, take breaks often, if you are able. Talk with your manager about doing your work in a way that is safe for you. ? If your tennis elbow is caused by computer use, talk with your manager about any changes that can be made to your work setup. General instructions  If told, apply ice to the painful area: ? Put ice in a plastic bag. ? Place a towel between your skin and the bag. ? Leave the ice on for 20 minutes, 2-3 times per day.  Take medicines only as told by your doctor.  If you were given a brace, wear it as told by your doctor.  Keep all follow-up visits as told by your doctor. This is important. Contact a doctor if:  Your pain does not get better with treatment.  Your pain gets worse.  You have weakness in your forearm, hand, or fingers.  You cannot feel your forearm, hand, or fingers. This information is not intended to replace advice given to you by your health  care provider. Make sure you discuss any questions you have with your health care provider. Document Released: 05/25/2010 Document Revised: 08/04/2016 Document Reviewed: 12/01/2014 Elsevier Interactive Patient Education  2018 [place] Ask your health care provider which exercises are safe for you. Do exercises exactly as told by your health care provider and adjust them as directed. It is normal to feel mild stretching, pulling, tightness, or discomfort as you do these exercises, but you should stop right away if you feel sudden pain or your pain gets worse. Do not begin these exercises until told by your health care provider. Stretching and range of motion exercises These exercises warm up your muscles and joints and improve the movement and flexibility of your elbow. These exercises also help to relieve pain, numbness, and tingling. Exercise A: Wrist extensor stretch 1. Extend your left / right elbow with your fingers pointing down. 2. Gently pull the palm of your left / right hand toward you until you feel a gentle stretch on the top of your forearm. 3. To increase the stretch, push your left / right hand toward the outer edge or pinkie side of your forearm. 4. Hold this position for __________ seconds. Repeat __________ times. Complete this exercise __________ times a day. If directed by your health care provider, repeat  this stretch except do it with a bent elbow this time. Exercise B: Wrist flexor stretch  1. Extend your left / right elbow and turn your palm upward. 2. Gently pull your left / right palm and fingertips back so your wrist extends and your fingers point more toward the ground. 3. You should feel a gentle stretch on the inside of your forearm. 4. Hold this position for __________ seconds. Repeat __________ times. Complete this exercise __________ times a day. If directed by your health care provider, repeat this stretch except do it with a bent elbow  this time. Strengthening exercises These exercises build strength and endurance in your elbow. Endurance is the ability to use your muscles for a long time, even after they get tired. Exercise C: Wrist extensors  1. Sit with your left / right forearm palm-down and fully supported on a table or countertop. Your elbow should be resting below the height of your shoulder. 2. Let your left / right wrist extend over the edge of the surface. 3. Loosely hold a __________ weight or a piece of rubber exercise band or tubing in your left / right hand. Slowly curl your left / right hand up toward your forearm. If you are using band or tubing, hold the band or tubing in place with your other hand to provide resistance. 4. Hold this position for __________ seconds. 5. Slowly return to the starting position. Repeat __________ times. Complete this exercise __________ times a day. Exercise D: Radial deviators  1. Stand with a __________ weight in your left / righthand. Or, sit while holding a rubber exercise band or tubing with your other arm supported on a table or countertop. Position your hand so your thumb is on top. 2. Raise your hand upward in front of you so your thumb travels toward your forearm, or pull up on the rubber tubing. 3. Hold this position for __________ seconds. 4. Slowly return to the starting position. Repeat __________ times. Complete this exercise __________ times a day. Exercise E: Eccentric wrist extensors 1. Sit with your left / right forearm palm-down and fully supported on a table or countertop. Your elbow should be resting below the height of your shoulder. 2. If told by your health care provider, hold a __________ weight in your hand. 3. Let your left / right wrist extend over the edge of the surface. 4. Use your other hand to lift up your left / right hand toward your forearm. Keep your forearm on the table. 5. Using only the muscles in your left / right hand, slowly lower your  hand back down to the starting position. Repeat __________ times. Complete this exercise __________ times a day. This information is not intended to replace advice given to you by your health care provider. Make sure you discuss any questions you have with your health care provider. Document Released: 12/05/2005 Document Revised: 08/10/2016 Document Reviewed: 09/03/2015 Elsevier Interactive Patient Education  [employer].

## 2018-07-06 NOTE — Progress Notes (Signed)
BP 126/85 (BP Location: Left Arm, Patient Position: Sitting, Cuff Size: Large)   Pulse 66   Temp 98.4 F (36.9 C)   Wt 222 lb 1 oz (100.7 kg)   SpO2 98%   BMI 38.72 kg/m    Subjective:    Patient ID: Bailey Perry, female    DOB: October 03, 1976, 42 y.o.   MRN: [id number]  HPI: Bailey Perry is a 42 y.o. female  Chief Complaint  Patient presents with  . Arm Pain    Right X 2 months, progressively getting worse, pain near elbow now   ARM PAIN Duration: 2 months Location: right forearm and elbow Mechanism of injury: unknown Onset: sudden Severity: severe  Quality:  Aching pain Frequency: constant Radiation: yes- into her fingers Aggravating factors: twisting and moving it, especially in the evening   Alleviating factors: nothing   Status: worse Treatments attempted:blue emu   Relief with NSAIDs?:  No NSAIDs Taken Swelling: yes Redness: no  Warmth: no Trauma: no Chest pain: no  Shortness of breath: no  Fever: no Decreased sensation: no Paresthesias: yes Weakness: yes  Relevant past medical, surgical, family and social history reviewed and updated as indicated. Interim medical history since our last visit reviewed. Allergies and medications reviewed and updated.  Review of Systems  Constitutional: Negative.   Respiratory: Negative.   Cardiovascular: Negative.   Musculoskeletal: Positive for arthralgias and myalgias. Negative for back pain, gait problem, joint swelling, neck pain and neck stiffness.  Skin: Negative.   Neurological: Positive for weakness. Negative for dizziness, tremors, seizures, syncope, facial asymmetry, speech difficulty, light-headedness, numbness and headaches.  Psychiatric/Behavioral: Negative.     Per HPI unless specifically indicated above     Objective:    BP 126/85 (BP Location: Left Arm, Patient Position: Sitting, Cuff Size: Large)   Pulse 66   Temp 98.4 F (36.9 C)   Wt 222 lb 1 oz (100.7 kg)   SpO2 98%   BMI 38.72  kg/m   Wt Readings from Last 3 Encounters:  07/06/18 222 lb 1 oz (100.7 kg)  12/18/17 217 lb 7 oz (98.6 kg)  10/17/17 215 lb (97.5 kg)    Physical Exam  Constitutional: She is oriented to person, place, and time. She appears well-developed and well-nourished. No distress.  HENT:  Head: Normocephalic and atraumatic.  Right Ear: Hearing normal.  Left Ear: Hearing normal.  Nose: Nose normal.  Eyes: Conjunctivae and lids are normal. Right eye exhibits no discharge. Left eye exhibits no discharge. No scleral icterus.  Cardiovascular: Normal rate, regular rhythm, normal heart sounds and intact distal pulses. Exam reveals no gallop and no friction rub.  No murmur heard. Pulmonary/Chest: Effort normal and breath sounds normal. No stridor. No respiratory distress. She has no wheezes. She has no rales. She exhibits no tenderness.  Musculoskeletal: She exhibits edema and tenderness (tenderness over lateral epicondyle, mildly swollen ).  Neurological: She is alert and oriented to person, place, and time.  Skin: Skin is warm, dry and intact. Capillary refill takes less than 2 seconds. No rash noted. She is not diaphoretic. No erythema. No pallor.  Psychiatric: She has a normal mood and affect. Her speech is normal and behavior is normal. Judgment and thought content normal. Cognition and memory are normal.  Nursing note and vitals reviewed.   Results for orders placed or performed in visit on 12/18/17  Microscopic Examination  Result Value Ref Range   WBC, UA 0-5 0 - 5 /hpf   RBC,  UA 0-2 0 - 2 /hpf   Epithelial Cells (non renal) 0-10 0 - 10 /hpf   Bacteria, UA None seen None seen/Few  CBC with Differential/Platelet  Result Value Ref Range   WBC 6.8 3.4 - 10.8 x10E3/uL   RBC 4.20 3.77 - 5.28 x10E6/uL   Hemoglobin 13.5 11.1 - 15.9 g/dL   Hematocrit 40.8 34.0 - 46.6 %   MCV 97 79 - 97 fL   MCH 32.1 26.6 - 33.0 pg   MCHC 33.1 31.5 - 35.7 g/dL   RDW 13.0 12.3 - 15.4 %   Platelets 383 (H) 150  - 379 x10E3/uL   Neutrophils 62 Not Estab. %   Lymphs 30 Not Estab. %   Monocytes 7 Not Estab. %   Eos 0 Not Estab. %   Basos 1 Not Estab. %   Neutrophils Absolute 4.2 1.4 - 7.0 x10E3/uL   Lymphocytes Absolute 2.0 0.7 - 3.1 x10E3/uL   Monocytes Absolute 0.5 0.1 - 0.9 x10E3/uL   EOS (ABSOLUTE) 0.0 0.0 - 0.4 x10E3/uL   Basophils Absolute 0.1 0.0 - 0.2 x10E3/uL   Immature Granulocytes 0 Not Estab. %   Immature Grans (Abs) 0.0 0.0 - 0.1 x10E3/uL  Comprehensive metabolic panel  Result Value Ref Range   Glucose 79 65 - 99 mg/dL   BUN 10 6 - 24 mg/dL   Creatinine, Ser 0.84 0.57 - 1.00 mg/dL   GFR calc non Af Amer 85 >59 mL/min/1.73   GFR calc Af Amer 98 >59 mL/min/1.73   BUN/Creatinine Ratio 12 9 - 23   Sodium 141 134 - 144 mmol/L   Potassium 3.8 3.5 - 5.2 mmol/L   Chloride 101 96 - 106 mmol/L   CO2 21 20 - 29 mmol/L   Calcium 9.1 8.7 - 10.2 mg/dL   Total Protein 7.6 6.0 - 8.5 g/dL   Albumin 4.4 3.5 - 5.5 g/dL   Globulin, Total 3.2 1.5 - 4.5 g/dL   Albumin/Globulin Ratio 1.4 1.2 - 2.2   Bilirubin Total 0.3 0.0 - 1.2 mg/dL   Alkaline Phosphatase 99 39 - 117 IU/L   AST 26 0 - 40 IU/L   ALT 16 0 - 32 IU/L  Lipid Panel w/o Chol/HDL Ratio  Result Value Ref Range   Cholesterol, Total 173 100 - 199 mg/dL   Triglycerides 44 0 - 149 mg/dL   HDL 68 >39 mg/dL   VLDL Cholesterol Cal 9 5 - 40 mg/dL   LDL Calculated 96 0 - 99 mg/dL  TSH  Result Value Ref Range   TSH 1.100 0.450 - 4.500 uIU/mL  UA/M w/rflx Culture, Routine  Result Value Ref Range   Specific Gravity, UA 1.020 1.005 - 1.030   pH, UA 5.5 5.0 - 7.5   Color, UA Yellow Yellow   Appearance Ur Hazy (A) Clear   Leukocytes, UA Trace (A) Negative   Protein, UA Negative Negative/Trace   Glucose, UA Negative Negative   Ketones, UA Negative Negative   RBC, UA Trace (A) Negative   Bilirubin, UA Negative Negative   Urobilinogen, Ur 1.0 0.2 - 1.0 mg/dL   Nitrite, UA Negative Negative   Microscopic Examination See below:   VITAMIN  D 25 Hydroxy (Vit-D Deficiency, Fractures)  Result Value Ref Range   Vit D, 25-Hydroxy 31.1 30.0 - 100.0 ng/mL  IGP, Aptima HPV, rfx 16/18,45  Result Value Ref Range   DIAGNOSIS: Comment    Specimen adequacy: Comment    Clinician Provided ICD10 Comment    Performed by:  Comment    Electronically signed by: Comment    PAP Smear Comment .    Note: Comment    Test Methodology Comment    HPV Aptima Negative Negative      Assessment & Plan:   Problem List Items Addressed This Visit    None    Visit Diagnoses    Lateral epicondylitis of right elbow    -  Primary   Will treat with burst of prednisone and then naproxen and course of flexeril. Rx for brace and exercises given. Call if not getting better and will give shot.    Relevant Medications   predniSONE (DELTASONE) 50 MG tablet   cyclobenzaprine (FLEXERIL) 10 MG tablet   naproxen (NAPROSYN) 500 MG tablet       Follow up plan: Return if symptoms worsen or fail to improve.

## 2018-10-16 ENCOUNTER — Encounter: Payer: Self-pay | Admitting: Family Medicine

## 2018-10-16 ENCOUNTER — Ambulatory Visit (INDEPENDENT_AMBULATORY_CARE_PROVIDER_SITE_OTHER): Payer: No Typology Code available for payment source | Admitting: Family Medicine

## 2018-10-16 VITALS — BP 124/82 | HR 84 | Wt 223.2 lb

## 2018-10-16 DIAGNOSIS — M7711 Lateral epicondylitis, right elbow: Secondary | ICD-10-CM | POA: Diagnosis not present

## 2018-10-16 DIAGNOSIS — Z23 Encounter for immunization: Secondary | ICD-10-CM

## 2018-10-16 NOTE — Patient Instructions (Addendum)
Influenza (Flu) Vaccine (Inactivated or Recombinant): What You Need to Know 1. Why get vaccinated? Influenza ("flu") is a contagious disease that spreads around the [place] every year, usually between October and May. Flu is caused by influenza viruses, and is spread mainly by coughing, sneezing, and close contact. Anyone can get flu. Flu strikes suddenly and can last several days. Symptoms vary by age, but can include:  fever/chills  sore throat  muscle aches  fatigue  cough  headache  runny or stuffy nose  Flu can also lead to pneumonia and blood infections, and cause diarrhea and seizures in children. If you have a medical condition, such as heart or lung disease, flu can make it worse. Flu is more dangerous for some people. Infants and young children, people 23 years of age and older, pregnant women, and people with certain health conditions or a weakened immune system are at greatest risk. Each year thousands of people in the [place] die from flu, and many more are hospitalized. Flu vaccine can:  keep you from getting flu,  make flu less severe if you do get it, and  keep you from spreading flu to your family and other people. 2. Inactivated and recombinant flu vaccines A dose of flu vaccine is recommended every flu season. Children 6 months through 91 years of age may need two doses during the same flu season. Everyone else needs only one dose each flu season. Some inactivated flu vaccines contain a very small amount of a mercury-based preservative called thimerosal. Studies have not shown thimerosal in vaccines to be harmful, but flu vaccines that do not contain thimerosal are available. There is no live flu virus in flu shots. They cannot cause the flu. There are many flu viruses, and they are always changing. Each year a new flu vaccine is made to protect against three or four viruses that are likely to cause disease in the upcoming flu season. But even when the  vaccine doesn't exactly match these viruses, it may still provide some protection. Flu vaccine cannot prevent:  flu that is caused by a virus not covered by the vaccine, or  illnesses that look like flu but are not.  It takes about 2 weeks for protection to develop after vaccination, and protection lasts through the flu season. 3. Some people should not get this vaccine Tell the person who is giving you the vaccine:  If you have any severe, life-threatening allergies. If you ever had a life-threatening allergic reaction after a dose of flu vaccine, or have a severe allergy to any part of this vaccine, you may be advised not to get vaccinated. Most, but not all, types of flu vaccine contain a small amount of egg protein.  If you ever had Guillain-Barr Syndrome (also called GBS). Some people with a history of GBS should not get this vaccine. This should be discussed with your doctor.  If you are not feeling well. It is usually okay to get flu vaccine when you have a mild illness, but you might be asked to come back when you feel better.  4. Risks of a vaccine reaction With any medicine, including vaccines, there is a chance of reactions. These are usually mild and go away on their own, but serious reactions are also possible. Most people who get a flu shot do not have any problems with it. Minor problems following a flu shot include:  soreness, redness, or swelling where the shot was given  hoarseness  sore,  red or itchy eyes  cough  fever  aches  headache  itching  fatigue  If these problems occur, they usually begin soon after the shot and last 1 or 2 days. More serious problems following a flu shot can include the following:  There may be a small increased risk of Guillain-Barre Syndrome (GBS) after inactivated flu vaccine. This risk has been estimated at 1 or 2 additional cases per million people vaccinated. This is much lower than the risk of severe complications from  flu, which can be prevented by flu vaccine.  Young children who get the flu shot along with pneumococcal vaccine (PCV13) and/or DTaP vaccine at the same time might be slightly more likely to have a seizure caused by fever. Ask your doctor for more information. Tell your doctor if a child who is getting flu vaccine has ever had a seizure.  Problems that could happen after any injected vaccine:  People sometimes faint after a medical procedure, including vaccination. Sitting or lying down for about 15 minutes can help prevent fainting, and injuries caused by a fall. Tell your doctor if you feel dizzy, or have vision changes or ringing in the ears.  Some people get severe pain in the shoulder and have difficulty moving the arm where a shot was given. This happens very rarely.  Any medication can cause a severe allergic reaction. Such reactions from a vaccine are very rare, estimated at about 1 in a million doses, and would happen within a few minutes to a few hours after the vaccination. As with any medicine, there is a very remote chance of a vaccine causing a serious injury or death. The safety of vaccines is always being monitored. For more information, visit: http://www.aguilar.org/ 5. What if there is a serious reaction? What should I look for? Look for anything that concerns you, such as signs of a severe allergic reaction, very high fever, or unusual behavior. Signs of a severe allergic reaction can include hives, swelling of the face and throat, difficulty breathing, a fast heartbeat, dizziness, and weakness. These would start a few minutes to a few hours after the vaccination. What should I do?  If you think it is a severe allergic reaction or other emergency that can't wait, call 9-1-1 and get the person to the nearest hospital. Otherwise, call your doctor.  Reactions should be reported to the Vaccine Adverse Event Reporting System (VAERS). Your doctor should file this report, or you  can do it yourself through the VAERS web site at www.vaers.SamedayNews.es, or by calling 6094730752. ? VAERS does not give medical advice. 6. The National Vaccine Injury Compensation Program The [employer] Vaccine Injury Compensation Program (VICP) is a federal program that was created to compensate people who may have been injured by certain vaccines. Persons who believe they may have been injured by a vaccine can learn about the program and about filing a claim by calling 458-267-6070 or visiting the [place] website at GoldCloset.com.ee. There is a time limit to file a claim for compensation. 7. How can I learn more?  Ask your healthcare provider. He or she can give you the vaccine package insert or suggest other sources of information.  Call your local or state health department.  Contact the Centers for Disease Control and Prevention (CDC): ? Call (540)164-9661 (1-800-CDC-INFO) or ? Visit CDC's website at https://gibson.com/ Vaccine Information Statement, Inactivated Influenza Vaccine (07/25/2014) This information is not intended to replace advice given to you by your health care provider. Make sure  you discuss any questions you have with your health care provider. Document Released: 09/29/2006 Document Revised: 08/25/2016 Document Reviewed: 08/25/2016 Elsevier Interactive Patient Education  2017 [employer].  Tennis Elbow Tennis elbow (lateral epicondylitis) is inflammation of the outer tendons of your forearm close to your elbow. Your tendons attach your muscles to your bones. The outer tendons of your forearm are used to extend your wrist, and they attach on the outside part of your elbow. Tennis elbow is often found in people who play tennis, but anyone may get the condition from repeatedly extending the wrist or turning the forearm. What are the causes? This condition is caused by repeatedly extending your wrist and using your hands. It can result from sports or work that  requires repetitive forearm movements. Tennis elbow may also be caused by an injury. What increases the risk? You have a higher risk of developing tennis elbow if you play tennis or another racquet sport. You also have a higher risk if you frequently use your hands for work. This condition is also more likely to develop in:  Musicians.  Carpenters, painters, and plumbers.  Cooks.  Cashiers.  People who work in [employer].  Architect workers.  Butchers.  People who use computers.  What are the signs or symptoms? Symptoms of this condition include:  Pain and tenderness in your forearm and the outer part of your elbow. You may only feel the pain when you use your arm, or you may feel it even when you are not using your arm.  A burning feeling that runs from your elbow through your arm.  Weak grip in your hands.  How is this diagnosed? This condition may be diagnosed by medical history and physical exam. You may also have other tests, including:  X-rays.  MRI.  How is this treated? Your health care provider will recommend lifestyle adjustments, such as resting and icing your arm. Treatment may also include:  Medicines for inflammation. This may include shots of cortisone if your pain continues.  Physical therapy. This may include massage or exercises.  An elbow brace.  Surgery may eventually be recommended if your pain does not go away with treatment. Follow these instructions at home: Activity  Rest your elbow and wrist as directed by your health care provider. Try to avoid any activities that caused the problem until your health care provider says that you can do them again.  If a physical therapist teaches you exercises, do all of them as directed.  If you lift an object, lift it with your palm facing upward. This lowers the stress on your elbow. Lifestyle  If your tennis elbow is caused by sports, check your equipment and make sure that: ? You are using it  correctly. ? It is the best fit for you.  If your tennis elbow is caused by work, take breaks frequently, if you are able. Talk with your manager about how to best perform tasks in a way that is safe. ? If your tennis elbow is caused by computer use, talk with your manager about any changes that can be made to your work environment. General instructions  If directed, apply ice to the painful area: ? Put ice in a plastic bag. ? Place a towel between your skin and the bag. ? Leave the ice on for 20 minutes, 2-3 times per day.  Take medicines only as directed by your health care provider.  If you were given a brace, wear it as directed by  your health care provider.  Keep all follow-up visits as directed by your health care provider. This is important. Contact a health care provider if:  Your pain does not get better with treatment.  Your pain gets worse.  You have numbness or weakness in your forearm, hand, or fingers. This information is not intended to replace advice given to you by your health care provider. Make sure you discuss any questions you have with your health care provider. Document Released: 12/05/2005 Document Revised: 08/04/2016 Document Reviewed: 12/01/2014 Elsevier Interactive Patient Education  2018 [place] Ask your health care provider which exercises are safe for you. Do exercises exactly as told by your health care provider and adjust them as directed. It is normal to feel mild stretching, pulling, tightness, or discomfort as you do these exercises, but you should stop right away if you feel sudden pain or your pain gets worse. Do not begin these exercises until told by your health care provider. Stretching and range of motion exercises These exercises warm up your muscles and joints and improve the movement and flexibility of your elbow. These exercises also help to relieve pain, numbness, and tingling. Exercise A: Wrist extensor  stretch 1. Extend your left / right elbow with your fingers pointing down. 2. Gently pull the palm of your left / right hand toward you until you feel a gentle stretch on the top of your forearm. 3. To increase the stretch, push your left / right hand toward the outer edge or pinkie side of your forearm. 4. Hold this position for __________ seconds. Repeat __________ times. Complete this exercise __________ times a day. If directed by your health care provider, repeat this stretch except do it with a bent elbow this time. Exercise B: Wrist flexor stretch  1. Extend your left / right elbow and turn your palm upward. 2. Gently pull your left / right palm and fingertips back so your wrist extends and your fingers point more toward the ground. 3. You should feel a gentle stretch on the inside of your forearm. 4. Hold this position for __________ seconds. Repeat __________ times. Complete this exercise __________ times a day. If directed by your health care provider, repeat this stretch except do it with a bent elbow this time. Strengthening exercises These exercises build strength and endurance in your elbow. Endurance is the ability to use your muscles for a long time, even after they get tired. Exercise C: Wrist extensors  1. Sit with your left / right forearm palm-down and fully supported on a table or countertop. Your elbow should be resting below the height of your shoulder. 2. Let your left / right wrist extend over the edge of the surface. 3. Loosely hold a __________ weight or a piece of rubber exercise band or tubing in your left / right hand. Slowly curl your left / right hand up toward your forearm. If you are using band or tubing, hold the band or tubing in place with your other hand to provide resistance. 4. Hold this position for __________ seconds. 5. Slowly return to the starting position. Repeat __________ times. Complete this exercise __________ times a day. Exercise D: Radial  deviators  1. Stand with a __________ weight in your left / righthand. Or, sit while holding a rubber exercise band or tubing with your other arm supported on a table or countertop. Position your hand so your thumb is on top. 2. Raise your hand upward in front of  you so your thumb travels toward your forearm, or pull up on the rubber tubing. 3. Hold this position for __________ seconds. 4. Slowly return to the starting position. Repeat __________ times. Complete this exercise __________ times a day. Exercise E: Eccentric wrist extensors 1. Sit with your left / right forearm palm-down and fully supported on a table or countertop. Your elbow should be resting below the height of your shoulder. 2. If told by your health care provider, hold a __________ weight in your hand. 3. Let your left / right wrist extend over the edge of the surface. 4. Use your other hand to lift up your left / right hand toward your forearm. Keep your forearm on the table. 5. Using only the muscles in your left / right hand, slowly lower your hand back down to the starting position. Repeat __________ times. Complete this exercise __________ times a day. This information is not intended to replace advice given to you by your health care provider. Make sure you discuss any questions you have with your health care provider. Document Released: 12/05/2005 Document Revised: 08/10/2016 Document Reviewed: 09/03/2015 Elsevier Interactive Patient Education  [employer].

## 2018-10-16 NOTE — Progress Notes (Signed)
BP 124/82   Pulse 84   Wt 223 lb 3.2 oz (101.2 kg)   SpO2 99%   BMI 38.92 kg/m    Subjective:    Patient ID: Bailey Perry, female    DOB: [birth year], 42 y.o.   MRN: [id number]  HPI: Bailey Perry is a 42 y.o. female  Chief Complaint  Patient presents with  . Elbow Pain    Better when on predniSONE, pain returned after finishing. Pain is constant.    ARM PAIN Duration: 5 months Location: elbow Mechanism of injury: unknown Onset: gradual Severity: severe  Quality:  sharp Frequency: constant Radiation: yes into hand Aggravating factors:moving her arm   Alleviating factors: prednisone   Status: worse Treatments attempted: prednisone, flexeril, brace, rest, ice, heat, APAP, ibuprofen, aleve and HEP  Relief with NSAIDs?:  mild Swelling: yes Redness: no  Warmth: no Trauma: no Chest pain: no  Shortness of breath: no  Fever: no Decreased sensation: no Paresthesias: yes Weakness: yes  Relevant past medical, surgical, family and social history reviewed and updated as indicated. Interim medical history since our last visit reviewed. Allergies and medications reviewed and updated.  Review of Systems  Constitutional: Negative.   Respiratory: Negative.   Cardiovascular: Negative.   Musculoskeletal: Positive for arthralgias and myalgias. Negative for back pain, gait problem, joint swelling, neck pain and neck stiffness.  Skin: Negative.   Neurological: Negative.   Psychiatric/Behavioral: Negative.     Per HPI unless specifically indicated above     Objective:    BP 124/82   Pulse 84   Wt 223 lb 3.2 oz (101.2 kg)   SpO2 99%   BMI 38.92 kg/m   Wt Readings from Last 3 Encounters:  10/16/18 223 lb 3.2 oz (101.2 kg)  07/06/18 222 lb 1 oz (100.7 kg)  12/18/17 217 lb 7 oz (98.6 kg)    Physical Exam  Constitutional: She is oriented to person, place, and time. She appears well-developed and well-nourished. No distress.  HENT:  Head: Normocephalic and  atraumatic.  Right Ear: Hearing normal.  Left Ear: Hearing normal.  Nose: Nose normal.  Eyes: Conjunctivae and lids are normal. Right eye exhibits no discharge. Left eye exhibits no discharge. No scleral icterus.  Cardiovascular: Normal rate, regular rhythm, normal heart sounds and intact distal pulses. Exam reveals no gallop and no friction rub.  No murmur heard. Pulmonary/Chest: Effort normal and breath sounds normal. No stridor. No respiratory distress. She has no wheezes. She has no rales. She exhibits no tenderness.  Musculoskeletal: Normal range of motion. She exhibits edema and tenderness. She exhibits no deformity.  Tenderness over lateral epicondyle on the R  Neurological: She is alert and oriented to person, place, and time.  Skin: Skin is warm, dry and intact. No rash noted. She is not diaphoretic. No erythema. No pallor.  Psychiatric: She has a normal mood and affect. Her speech is normal and behavior is normal. Judgment and thought content normal. Cognition and memory are normal.  Nursing note and vitals reviewed.   Results for orders placed or performed in visit on 12/18/17  Microscopic Examination  Result Value Ref Range   WBC, UA 0-5 0 - 5 /hpf   RBC, UA 0-2 0 - 2 /hpf   Epithelial Cells (non renal) 0-10 0 - 10 /hpf   Bacteria, UA None seen None seen/Few  CBC with Differential/Platelet  Result Value Ref Range   WBC 6.8 3.4 - 10.8 x10E3/uL   RBC 4.20 3.77 -  5.28 x10E6/uL   Hemoglobin 13.5 11.1 - 15.9 g/dL   Hematocrit 40.8 34.0 - 46.6 %   MCV 97 79 - 97 fL   MCH 32.1 26.6 - 33.0 pg   MCHC 33.1 31.5 - 35.7 g/dL   RDW 13.0 12.3 - 15.4 %   Platelets 383 (H) 150 - 379 x10E3/uL   Neutrophils 62 Not Estab. %   Lymphs 30 Not Estab. %   Monocytes 7 Not Estab. %   Eos 0 Not Estab. %   Basos 1 Not Estab. %   Neutrophils Absolute 4.2 1.4 - 7.0 x10E3/uL   Lymphocytes Absolute 2.0 0.7 - 3.1 x10E3/uL   Monocytes Absolute 0.5 0.1 - 0.9 x10E3/uL   EOS (ABSOLUTE) 0.0 0.0 - 0.4  x10E3/uL   Basophils Absolute 0.1 0.0 - 0.2 x10E3/uL   Immature Granulocytes 0 Not Estab. %   Immature Grans (Abs) 0.0 0.0 - 0.1 x10E3/uL  Comprehensive metabolic panel  Result Value Ref Range   Glucose 79 65 - 99 mg/dL   BUN 10 6 - 24 mg/dL   Creatinine, Ser 0.84 0.57 - 1.00 mg/dL   GFR calc non Af Amer 85 >59 mL/min/1.73   GFR calc Af Amer 98 >59 mL/min/1.73   BUN/Creatinine Ratio 12 9 - 23   Sodium 141 134 - 144 mmol/L   Potassium 3.8 3.5 - 5.2 mmol/L   Chloride 101 96 - 106 mmol/L   CO2 21 20 - 29 mmol/L   Calcium 9.1 8.7 - 10.2 mg/dL   Total Protein 7.6 6.0 - 8.5 g/dL   Albumin 4.4 3.5 - 5.5 g/dL   Globulin, Total 3.2 1.5 - 4.5 g/dL   Albumin/Globulin Ratio 1.4 1.2 - 2.2   Bilirubin Total 0.3 0.0 - 1.2 mg/dL   Alkaline Phosphatase 99 39 - 117 IU/L   AST 26 0 - 40 IU/L   ALT 16 0 - 32 IU/L  Lipid Panel w/o Chol/HDL Ratio  Result Value Ref Range   Cholesterol, Total 173 100 - 199 mg/dL   Triglycerides 44 0 - 149 mg/dL   HDL 68 >39 mg/dL   VLDL Cholesterol Cal 9 5 - 40 mg/dL   LDL Calculated 96 0 - 99 mg/dL  TSH  Result Value Ref Range   TSH 1.100 0.450 - 4.500 uIU/mL  UA/M w/rflx Culture, Routine  Result Value Ref Range   Specific Gravity, UA 1.020 1.005 - 1.030   pH, UA 5.5 5.0 - 7.5   Color, UA Yellow Yellow   Appearance Ur Hazy (A) Clear   Leukocytes, UA Trace (A) Negative   Protein, UA Negative Negative/Trace   Glucose, UA Negative Negative   Ketones, UA Negative Negative   RBC, UA Trace (A) Negative   Bilirubin, UA Negative Negative   Urobilinogen, Ur 1.0 0.2 - 1.0 mg/dL   Nitrite, UA Negative Negative   Microscopic Examination See below:   VITAMIN D 25 Hydroxy (Vit-D Deficiency, Fractures)  Result Value Ref Range   Vit D, 25-Hydroxy 31.1 30.0 - 100.0 ng/mL  IGP, Aptima HPV, rfx 16/18,45  Result Value Ref Range   DIAGNOSIS: Comment    Specimen adequacy: Comment    Clinician Provided ICD10 Comment    Performed by: Comment    Electronically signed by:  Comment    PAP Smear Comment .    Note: Comment    Test Methodology Comment    HPV Aptima Negative Negative      Assessment & Plan:   Problem List Items Addressed This  Visit    None    Visit Diagnoses    Lateral epicondylitis of right elbow    -  Primary   Offered referral to PT and ortho- declined. Will do steroid injection today, as below. Call if not getting better and we'll get her into PT/Ortho   Needs flu shot       Flu shot given today.   Relevant Orders   Flu Vaccine QUAD 6+ mos PF IM (Fluarix Quad PF) (Completed)      Procedure: Right Lateral Epicondylitis Steroid Injection        Diagnosis:   ICD-10-CM   1. Lateral epicondylitis of right elbow M77.11    Offered referral to PT and ortho- declined. Will do steroid injection today, as below. Call if not getting better and we'll get her into PT/Ortho  2. Needs flu shot Z23 Flu Vaccine QUAD 6+ mos PF IM (Fluarix Quad PF)   Flu shot given today.    Physician: MJ Consent:  Risks, benefits, and alternative treatments discussed and all questions were answered.  Patient elected to proceed and verbal consent obtained.  Description: Area prepped and draped using  semi-sterile technique.  After identifying area of maximal tenderness, A mixture of 1cc 1% lidocaine and 1cc Kenalog 40 was injected.  A bandage was then placed over the injection site. Complications: none Post Procedure Instructions: Wound care instructions discussed and patient was instructed to keep area clean and dry.  Signs and symptoms of infection discussed, patient agrees to contact the office ASAP should they occur.   Follow up plan: Return if symptoms worsen or fail to improve.

## 2019-04-05 ENCOUNTER — Telehealth: Payer: Self-pay | Admitting: Family Medicine

## 2019-04-05 NOTE — Telephone Encounter (Signed)
Copied from [place] [id number] [phone]. Topic: General - Other >> Apr 05, 2019  1:15 PM Mcneil, Ja-Kwan wrote: Reason for CRM: Pt stated she is having problems digesting her food and she would like a Rx for a medication to help with reflux. Pt stated she did not want to schedule an appt she just needed the Rx to be sent to her pharmacy. Pt stated she was prescribed a medication previously to help with reflux but it was some time ago so she can not remember the name of the medication. Pt request Rx to be sent to [id number] - [place], [place] (Phone) [phone] (Fax)

## 2019-04-05 NOTE — Telephone Encounter (Signed)
Needs virtual visit 

## 2019-04-05 NOTE — Telephone Encounter (Signed)
Tried to reach patient to get scheduled.  LVM for patient to call back to schedule a virtual appointment.

## 2019-04-08 ENCOUNTER — Encounter: Payer: Self-pay | Admitting: Family Medicine

## 2019-04-08 ENCOUNTER — Other Ambulatory Visit: Payer: Self-pay

## 2019-04-08 ENCOUNTER — Ambulatory Visit (INDEPENDENT_AMBULATORY_CARE_PROVIDER_SITE_OTHER): Payer: No Typology Code available for payment source | Admitting: Family Medicine

## 2019-04-08 VITALS — HR 80 | Ht 62.0 in | Wt 223.0 lb

## 2019-04-08 DIAGNOSIS — R1310 Dysphagia, unspecified: Secondary | ICD-10-CM

## 2019-04-08 MED ORDER — OMEPRAZOLE 40 MG PO CPDR: 40.0000 mg | DELAYED_RELEASE_CAPSULE | Freq: Every day | ORAL | 3 refills | Status: DC

## 2019-04-08 NOTE — Telephone Encounter (Signed)
Seen today. 

## 2019-04-08 NOTE — Progress Notes (Signed)
Pulse 80   Ht 5\' 2"  (1.575 m)   Wt 223 lb (101.2 kg)   BMI 40.79 kg/m    Subjective:    Patient ID: Bailey Perry, female    DOB: 1976-08-16, 43 y.o.   MRN: [id number]  HPI: Bailey Perry is a 43 y.o. female  Chief Complaint  Patient presents with  . Gastroesophageal Reflux    x about a month ago. pt states she can't swollow food   DYSPHAGIA Duration: about a month, started with regular heartburn and now has gotten to the point where she can't swallow Description of symptom: comes back up, "can't swallow" Onset: Immediately upon swallowing Location of dysphagia: chest and neck Dysphagia to solids only: no Dysphagia to solids & liquids: yes  Frequency:every time to she eats  Progressively getting worse: yes Alleviatiating factors: vomiting Provoking factors: any eating Status: worse EGD: no Weight loss: no Sensation of lump in throat: yes Heartburn: yes Odynophagia: no Nausea: no Vomiting: yes Drooling/nasal regurgitation/food spillage: no Coughing/choking/dysphonia: no Dysarthria: no Hematemesis: no Regurgitation of undigested food/halitosis: no Chest pain: no  Relevant past medical, surgical, family and social history reviewed and updated as indicated. Interim medical history since our last visit reviewed. Allergies and medications reviewed and updated.  Review of Systems  Constitutional: Negative.   HENT: Negative.   Respiratory: Negative.   Cardiovascular: Negative.   Gastrointestinal: Positive for vomiting. Negative for abdominal distention, abdominal pain, anal bleeding, blood in stool, constipation, diarrhea, nausea and rectal pain.  Genitourinary: Negative.   Musculoskeletal: Negative.   Skin: Negative.   Psychiatric/Behavioral: Negative.     Per HPI unless specifically indicated above     Objective:    Pulse 80   Ht 5\' 2"  (1.575 m)   Wt 223 lb (101.2 kg)   BMI 40.79 kg/m   Wt Readings from Last 3 Encounters:  04/08/19 223 lb  (101.2 kg)  10/16/18 223 lb 3.2 oz (101.2 kg)  07/06/18 222 lb 1 oz (100.7 kg)    Physical Exam Vitals signs and nursing note reviewed.  Constitutional:      General: She is not in acute distress.    Appearance: Normal appearance. She is not ill-appearing, toxic-appearing or diaphoretic.  HENT:     Head: Normocephalic and atraumatic.     Right Ear: External ear normal.     Left Ear: External ear normal.     Nose: Nose normal.     Mouth/Throat:     Mouth: Mucous membranes are moist.     Pharynx: Oropharynx is clear.  Eyes:     General: No scleral icterus.       Right eye: No discharge.        Left eye: No discharge.     Conjunctiva/sclera: Conjunctivae normal.     Pupils: Pupils are equal, round, and reactive to light.  Neck:     Musculoskeletal: Normal range of motion.  Pulmonary:     Effort: Pulmonary effort is normal. No respiratory distress.     Comments: Speaking in full sentences Musculoskeletal: Normal range of motion.  Skin:    Coloration: Skin is not jaundiced or pale.     Findings: No bruising, erythema, lesion or rash.  Neurological:     Mental Status: She is alert and oriented to person, place, and time. Mental status is at baseline.  Psychiatric:        Mood and Affect: Mood normal.        Behavior: Behavior normal.  Thought Content: Thought content normal.        Judgment: Judgment normal.     Results for orders placed or performed in visit on 12/18/17  Microscopic Examination  Result Value Ref Range   WBC, UA 0-5 0 - 5 /hpf   RBC, UA 0-2 0 - 2 /hpf   Epithelial Cells (non renal) 0-10 0 - 10 /hpf   Bacteria, UA None seen None seen/Few  CBC with Differential/Platelet  Result Value Ref Range   WBC 6.8 3.4 - 10.8 x10E3/uL   RBC 4.20 3.77 - 5.28 x10E6/uL   Hemoglobin 13.5 11.1 - 15.9 g/dL   Hematocrit 40.8 34.0 - 46.6 %   MCV 97 79 - 97 fL   MCH 32.1 26.6 - 33.0 pg   MCHC 33.1 31.5 - 35.7 g/dL   RDW 13.0 12.3 - 15.4 %   Platelets 383 (H) 150 -  379 x10E3/uL   Neutrophils 62 Not Estab. %   Lymphs 30 Not Estab. %   Monocytes 7 Not Estab. %   Eos 0 Not Estab. %   Basos 1 Not Estab. %   Neutrophils Absolute 4.2 1.4 - 7.0 x10E3/uL   Lymphocytes Absolute 2.0 0.7 - 3.1 x10E3/uL   Monocytes Absolute 0.5 0.1 - 0.9 x10E3/uL   EOS (ABSOLUTE) 0.0 0.0 - 0.4 x10E3/uL   Basophils Absolute 0.1 0.0 - 0.2 x10E3/uL   Immature Granulocytes 0 Not Estab. %   Immature Grans (Abs) 0.0 0.0 - 0.1 x10E3/uL  Comprehensive metabolic panel  Result Value Ref Range   Glucose 79 65 - 99 mg/dL   BUN 10 6 - 24 mg/dL   Creatinine, Ser 0.84 0.57 - 1.00 mg/dL   GFR calc non Af Amer 85 >59 mL/min/1.73   GFR calc Af Amer 98 >59 mL/min/1.73   BUN/Creatinine Ratio 12 9 - 23   Sodium 141 134 - 144 mmol/L   Potassium 3.8 3.5 - 5.2 mmol/L   Chloride 101 96 - 106 mmol/L   CO2 21 20 - 29 mmol/L   Calcium 9.1 8.7 - 10.2 mg/dL   Total Protein 7.6 6.0 - 8.5 g/dL   Albumin 4.4 3.5 - 5.5 g/dL   Globulin, Total 3.2 1.5 - 4.5 g/dL   Albumin/Globulin Ratio 1.4 1.2 - 2.2   Bilirubin Total 0.3 0.0 - 1.2 mg/dL   Alkaline Phosphatase 99 39 - 117 IU/L   AST 26 0 - 40 IU/L   ALT 16 0 - 32 IU/L  Lipid Panel w/o Chol/HDL Ratio  Result Value Ref Range   Cholesterol, Total 173 100 - 199 mg/dL   Triglycerides 44 0 - 149 mg/dL   HDL 68 >39 mg/dL   VLDL Cholesterol Cal 9 5 - 40 mg/dL   LDL Calculated 96 0 - 99 mg/dL  TSH  Result Value Ref Range   TSH 1.100 0.450 - 4.500 uIU/mL  UA/M w/rflx Culture, Routine  Result Value Ref Range   Specific Gravity, UA 1.020 1.005 - 1.030   pH, UA 5.5 5.0 - 7.5   Color, UA Yellow Yellow   Appearance Ur Hazy (A) Clear   Leukocytes, UA Trace (A) Negative   Protein, UA Negative Negative/Trace   Glucose, UA Negative Negative   Ketones, UA Negative Negative   RBC, UA Trace (A) Negative   Bilirubin, UA Negative Negative   Urobilinogen, Ur 1.0 0.2 - 1.0 mg/dL   Nitrite, UA Negative Negative   Microscopic Examination See below:   VITAMIN D  25 Hydroxy (Vit-D Deficiency,  Fractures)  Result Value Ref Range   Vit D, 25-Hydroxy 31.1 30.0 - 100.0 ng/mL  IGP, Aptima HPV, rfx 16/18,45  Result Value Ref Range   DIAGNOSIS: Comment    Specimen adequacy: Comment    Clinician Provided ICD10 Comment    Performed by: Comment    Electronically signed by: Comment    PAP Smear Comment .    Note: Comment    Test Methodology Comment    HPV Aptima Negative Negative      Assessment & Plan:   Problem List Items Addressed This Visit    None    Visit Diagnoses    Dysphagia, unspecified type    -  Primary   Will start omeprazole 40mg  and get her into see GI. Referral generated today. Call with any concerns. Chew food throughoughly.    Relevant Orders   Ambulatory referral to Gastroenterology       Follow up plan: Return if symptoms worsen or fail to improve.    . This visit was completed via FaceTime due to the restrictions of the COVID-19 pandemic. All issues as above were discussed and addressed. Physical exam was done as above through visual confirmation on FaceTIme. If it was felt that the patient should be evaluated in the office, they were directed there. The patient verbally consented to this visit. . Location of the patient: parking lot . Location of the provider: home . Those involved with this call:  . Provider: Park Liter, DO . CMA: Lesle Chris, Beatrice . Front Desk/Registration: Don Perking  . Time spent on call: 15 minutes with patient face to face via video conference. More than 50% of this time was spent in counseling and coordination of care. 23 minutes total spent in review of patient's record and preparation of their chart.

## 2019-04-09 ENCOUNTER — Telehealth: Payer: Self-pay

## 2019-04-09 NOTE — Telephone Encounter (Signed)
Prior authorization for omeprazole was initiated via [email or web address]. Key: [id number]

## 2019-04-15 ENCOUNTER — Telehealth: Payer: Self-pay | Admitting: Family Medicine

## 2019-04-15 NOTE — Telephone Encounter (Signed)
Patient notified that the PA has been submitted.

## 2019-04-15 NOTE — Telephone Encounter (Signed)
Patient called and left a message requesting a call back about the status of the prior authorization of her omeprazole.

## 2019-05-08 ENCOUNTER — Telehealth: Payer: Self-pay | Admitting: Family Medicine

## 2019-05-08 NOTE — Telephone Encounter (Signed)
Tried to reach patient to schedule CPE with Dr Wynetta Emery but unable to reach. LVM for patient to call back.

## 2019-05-14 ENCOUNTER — Telehealth: Payer: Self-pay | Admitting: Family Medicine

## 2019-05-14 NOTE — Telephone Encounter (Signed)
Copied from [place] [phone]. Topic: Quick Communication - Rx Refill/Question >> May 14, 2019 10:15 AM Margot Ables wrote: Medication: omeprazole (PRILOSEC) 40 MG capsule - pt notes that her face was puffy, had a rash, and was red - she d/c this medication about a week ago and symptoms has stopped - please advise  .  Pt states she has been to a specialist and they have prescribed something else that started with a T. She said GI at [facility]. She didn't fill it thinking it was the same as omeprazole.

## 2019-05-15 ENCOUNTER — Other Ambulatory Visit: Payer: Self-pay

## 2019-05-15 NOTE — Telephone Encounter (Signed)
Patient having reaction to Omeprazole.

## 2019-05-15 NOTE — Telephone Encounter (Signed)
If she has a concern about what Dr. Alice Reichert prescribed, she will need to call Dr. Alice Reichert.

## 2019-05-16 ENCOUNTER — Encounter: Payer: Self-pay | Admitting: Family Medicine

## 2019-05-16 NOTE — Telephone Encounter (Signed)
Patient notified, she states that she is unable to get in touch with GI.

## 2019-05-16 NOTE — Telephone Encounter (Signed)
Called [facility] and left a message asking them to contact the patient about medication concerns

## 2019-05-16 NOTE — Telephone Encounter (Signed)
She should not take the omeprazole if she had a reaction to it. She should stop it. She should not restart it. She should call her GI doctor, to have something else prescribed and to ask any questions about the other medicine that he prescribed to her.

## 2019-05-16 NOTE — Telephone Encounter (Signed)
Can we reach out to Dr. Alice Reichert then so that they can reach out to her?

## 2019-05-16 NOTE — Telephone Encounter (Signed)
Tried to call patient, phone would not ring.

## 2019-07-09 ENCOUNTER — Other Ambulatory Visit: Payer: Self-pay | Admitting: Gastroenterology

## 2019-07-09 DIAGNOSIS — K219 Gastro-esophageal reflux disease without esophagitis: Secondary | ICD-10-CM

## 2019-07-09 DIAGNOSIS — R1314 Dysphagia, pharyngoesophageal phase: Secondary | ICD-10-CM

## 2019-07-15 ENCOUNTER — Ambulatory Visit
Admission: RE | Admit: 2019-07-15 | Discharge: 2019-07-15 | Disposition: A | Discharge status: Home / Self Care | Payer: PRIVATE HEALTH INSURANCE | Source: Ambulatory Visit | Attending: Gastroenterology | Admitting: Gastroenterology

## 2019-07-15 ENCOUNTER — Other Ambulatory Visit: Payer: Self-pay

## 2019-07-15 DIAGNOSIS — R1314 Dysphagia, pharyngoesophageal phase: Secondary | ICD-10-CM | POA: Diagnosis present

## 2019-07-15 DIAGNOSIS — K219 Gastro-esophageal reflux disease without esophagitis: Secondary | ICD-10-CM | POA: Diagnosis not present

## 2019-11-19 ENCOUNTER — Ambulatory Visit: Payer: Self-pay | Admitting: *Deleted

## 2019-11-19 NOTE — Telephone Encounter (Signed)
OK for virtual tomorrow

## 2019-11-19 NOTE — Telephone Encounter (Signed)
LVM for pt to call back.

## 2019-11-19 NOTE — Telephone Encounter (Signed)
Pt called with having upper back pain. She stated that she was in the shower and she tried to wash her back and hurt her back. She did not hear a pop. She felt like she hurt it. She has taken tylenol for the pain and it has not helped. She denies fever,any neurological symptoms. She stated that it hurts when she takes a deep breath.  She is at work now and wanted to make an appointment. Per protocol, she needs to be seen within 4 hours. Advised that if she can not get an appointment, she should go to an urgent care. She is requesting an appointment for tomorrow. She is advised that if she has any neurological symptoms or worst pain to be seen more urgently.  Notified [facility] for an appointment. Routing to the practice for review and recommendation and call back. Pt advised of this and voiced understanding. Reason for Disposition . [1] SEVERE back pain (e.g., excruciating, unable to do any normal activities) AND [2] not improved 2 hours after pain medicine  Answer Assessment - Initial Assessment Questions 1. ONSET: "When did the pain begin?"      today 2. LOCATION: "Where does it hurt?" (upper, mid or lower back)     Upper back 3. SEVERITY: "How bad is the pain?"  (e.g., Scale 1-10; mild, moderate, or severe)   - MILD (1-3): doesn't interfere with normal activities    - MODERATE (4-7): interferes with normal activities or awakens from sleep    - SEVERE (8-10): excruciating pain, unable to do any normal activities      Pain #10 4. PATTERN: "Is the pain constant?" (e.g., yes, no; constant, intermittent)      constant 5. RADIATION: "Does the pain shoot into your legs or elsewhere?"     Feels like it is in her chest also 6. CAUSE:  "What do you think is causing the back pain?"      Pulled muscle 7. BACK OVERUSE:  "Any recent lifting of heavy objects, strenuous work or exercise?"     In the shower today 8. MEDICATIONS: "What have you taken so far for the pain?" (e.g.,  nothing, acetaminophen, NSAIDS)     tylenol 9. NEUROLOGIC SYMPTOMS: "Do you have any weakness, numbness, or problems with bowel/bladder control?"     no 10. OTHER SYMPTOMS: "Do you have any other symptoms?" (e.g., fever, abdominal pain, burning with urination, blood in urine)       no 11. PREGNANCY: "Is there any chance you are pregnant?" (e.g., yes, no; LMP)       Not pregnant LMP was 2 weeks ago  Protocols used: BACK PAIN-A-AH

## 2019-11-20 ENCOUNTER — Encounter: Payer: Self-pay | Admitting: Family Medicine

## 2019-11-20 ENCOUNTER — Ambulatory Visit (INDEPENDENT_AMBULATORY_CARE_PROVIDER_SITE_OTHER): Payer: No Typology Code available for payment source | Admitting: Family Medicine

## 2019-11-20 ENCOUNTER — Other Ambulatory Visit: Payer: Self-pay

## 2019-11-20 DIAGNOSIS — M546 Pain in thoracic spine: Secondary | ICD-10-CM | POA: Diagnosis not present

## 2019-11-20 MED ORDER — CYCLOBENZAPRINE HCL 10 MG PO TABS: 10.0000 mg | ORAL_TABLET | Freq: Three times a day (TID) | ORAL | 0 refills | Status: DC | PRN

## 2019-11-20 MED ORDER — DICLOFENAC SODIUM 1 % EX GEL: 4.0000 g | Freq: Four times a day (QID) | CUTANEOUS | 3 refills | Status: DC

## 2019-11-20 NOTE — Patient Instructions (Signed)
Thoracic Strain Rehab Ask your health care provider which exercises are safe for you. Do exercises exactly as told by your health care provider and adjust them as directed. It is normal to feel mild stretching, pulling, tightness, or discomfort as you do these exercises. Stop right away if you feel sudden pain or your pain gets worse. Do not begin these exercises until told by your health care provider. Stretching and range-of-motion exercise This exercise warms up your muscles and joints and improves the movement and flexibility of your back and shoulders. This exercise also helps to relieve pain. Chest and spine stretch  1. Lie down on your back on a firm surface. 2. Roll a towel or a small blanket so it is about 4 inches (10 cm) in diameter. 3. Put the towel lengthwise under the middle of your back so it is under your spine, but not under your shoulder blades. 4. Put your hands behind your head and let your elbows fall to your sides. This will increase your stretch. 5. Take a deep breath (inhale). 6. Hold for __________ seconds. 7. Relax after you breathe out (exhale). Repeat __________ times. Complete this exercise __________ times a day. Strengthening exercises These exercises build strength and endurance in your back and your shoulder blade muscles. Endurance is the ability to use your muscles for a long time, even after they get tired. Alternating arm and leg raises  1. Get on your hands and knees on a firm surface. If you are on a hard floor, you may want to use padding, such as an exercise mat, to cushion your knees. 2. Line up your arms and legs. Your hands should be directly below your shoulders, and your knees should be directly below your hips. 3. Lift your left leg behind you. At the same time, raise your right arm and straighten it in front of you. ? Do not lift your leg higher than your hip. ? Do not lift your arm higher than your shoulder. ? Keep your abdominal and back  muscles tight. ? Keep your hips facing the ground. ? Do not arch your back. ? Keep your balance carefully, and do not hold your breath. 4. Hold for __________ seconds. 5. Slowly return to the starting position and repeat with your right leg and your left arm. Repeat __________ times. Complete this exercise __________ times a day. Straight arm rows This exercise is also called shoulder extension exercise. 1. Stand with your feet shoulder width apart. 2. Secure an exercise band to a stable object in front of you so the band is at or above shoulder height. 3. Hold one end of the exercise band in each hand. 4. Straighten your elbows and lift your hands up to shoulder height. 5. Step back, away from the secured end of the exercise band, until the band stretches. 6. Squeeze your shoulder blades together and pull your hands down to the sides of your thighs. Stop when your hands are straight down by your sides. This is shoulder extension. Do not let your hands go behind your body. 7. Hold for __________ seconds. 8. Slowly return to the starting position. Repeat __________ times. Complete this exercise __________ times a day. Prone shoulder external rotation 1. Lie on your abdomen on a firm bed so your left / right forearm hangs over the edge of the bed and your upper arm is on the bed, straight out from your body. This is the prone position. ? Your elbow should be bent. ?   Your palm should be facing your feet. 2. If instructed, hold a __________ weight in your hand. 3. Squeeze your shoulder blade toward the middle of your back. Do not let your shoulder lift toward your ear. 4. Keep your elbow bent in a 90-degree angle (right angle) while you slowly move your forearm up toward the ceiling. Move your forearm up to the height of the bed, toward your head. This is external rotation. ? Your upper arm should not move. ? At the top of the movement, your palm should face the floor. 5. Hold for __________  seconds. 6. Slowly return to the starting position and relax your muscles. Repeat __________ times. Complete this exercise __________ times a day. Rowing scapular retraction This is an exercise in which the shoulder blades (scapulae) are pulled toward each other (retraction). 1. Sit in a stable chair without armrests, or stand up. 2. Secure an exercise band to a stable object in front of you so the band is at shoulder height. 3. Hold one end of the exercise band in each hand. Your palms should face down. 4. Bring your arms out straight in front of you. 5. Step back, away from the secured end of the exercise band, until the band stretches. 6. Pull the band backward. As you do this, bend your elbows and squeeze your shoulder blades together, but avoid letting the rest of your body move. Do not shrug your shoulders upward while you do this. 7. Stop when your elbows are at your sides or slightly behind your body. 8. Hold for __________ seconds. 9. Slowly straighten your arms to return to the starting position. Repeat __________ times. Complete this exercise __________ times a day. Posture and body mechanics Good posture and healthy body mechanics can help to relieve stress in your body's tissues and joints. Body mechanics refers to the movements and positions of your body while you do your daily activities. Posture is part of body mechanics. Good posture means:  Your spine is in its natural S-curve position (neutral).  Your shoulders are pulled back slightly.  Your head is not tipped forward. Follow these guidelines to improve your posture and body mechanics in your everyday activities. Standing   When standing, keep your spine neutral and your feet about hip width apart. Keep a slight bend in your knees. Your ears, shoulders, and hips should line up with each other.  When you do a task in which you lean forward while standing in one place for a long time, place one foot up on a stable  object that is 2-4 inches (5-10 cm) high, such as a footstool. This helps keep your spine neutral. Sitting   When sitting, keep your spine neutral and keep your feet flat on the floor. Use a footrest, if necessary, and keep your thighs parallel to the floor. Avoid rounding your shoulders, and avoid tilting your head forward.  When working at a desk or a computer, keep your desk at a height where your hands are slightly lower than your elbows. Slide your chair under your desk so you are close enough to maintain good posture.  When working at a computer, place your monitor at a height where you are looking straight ahead and you do not have to tilt your head forward or downward to look at the screen. Resting When lying down and resting, avoid positions that are most painful for you.  If you have pain with activities such as sitting, bending, stooping, or squatting (flexion-basedactivities), lie   in a position in which your body does not bend very much. For example, avoid curling up on your side with your arms and knees near your chest (fetal position).  If you have pain with activities such as standing for a long time or reaching with your arms (extension-basedactivities), lie with your spine in a neutral position and bend your knees slightly. Try the following positions: ? Lie on your side with a pillow between your knees. ? Lie on your back with a pillow under your knees.  Lifting   When lifting objects, keep your feet at least shoulder width apart and tighten your abdominal muscles.  Bend your knees and hips and keep your spine neutral. It is important to lift using the strength of your legs, not your back. Do not lock your knees straight out.  Always ask for help to lift heavy or awkward objects. This information is not intended to replace advice given to you by your health care provider. Make sure you discuss any questions you have with your health care provider. Document Released:  12/05/2005 Document Revised: 03/29/2019 Document Reviewed: 01/14/2019 Elsevier Patient Education  2020 [employer].

## 2019-11-20 NOTE — Progress Notes (Signed)
There were no vitals taken for this visit.   Subjective:    Patient ID: Bailey Perry, female    DOB: 1976-01-01, 43 y.o.   MRN: [id number]  HPI: Bailey Perry is a 43 y.o. female  Chief Complaint  Patient presents with  . Back Pain    Pulled a muscle in shower yesterday.    BACK PAIN Duration: yesterday Mechanism of injury: in the shower and pulled her back  Location: upper middle back Onset: sudden Severity: moderate Quality: shooting pain, yesterday "pain" Frequency: constant Radiation: comes into her chest Aggravating factors: walking. moving Alleviating factors: heat Status: better Treatments attempted: rest, heat and APAP  Relief with NSAIDs?: No NSAIDs Taken Nighttime pain:  yes Paresthesias / decreased sensation:  no Bowel / bladder incontinence:  no Fevers:  no Dysuria / urinary frequency:  no  Relevant past medical, surgical, family and social history reviewed and updated as indicated. Interim medical history since our last visit reviewed. Allergies and medications reviewed and updated.  Review of Systems  Constitutional: Negative.   Respiratory: Negative.   Cardiovascular: Negative.   Gastrointestinal: Negative.   Musculoskeletal: Positive for back pain and myalgias. Negative for arthralgias, gait problem, joint swelling, neck pain and neck stiffness.  Skin: Negative.   Neurological: Negative.   Psychiatric/Behavioral: Negative.     Per HPI unless specifically indicated above     Objective:    There were no vitals taken for this visit.  Wt Readings from Last 3 Encounters:  04/08/19 223 lb (101.2 kg)  10/16/18 223 lb 3.2 oz (101.2 kg)  07/06/18 222 lb 1 oz (100.7 kg)    Physical Exam Vitals signs and nursing note reviewed.  Constitutional:      General: She is not in acute distress.    Appearance: Normal appearance. She is not ill-appearing, toxic-appearing or diaphoretic.  HENT:     Head: Normocephalic and atraumatic.     Right  Ear: External ear normal.     Left Ear: External ear normal.     Nose: Nose normal.     Mouth/Throat:     Mouth: Mucous membranes are moist.     Pharynx: Oropharynx is clear.  Eyes:     General: No scleral icterus.       Right eye: No discharge.        Left eye: No discharge.     Conjunctiva/sclera: Conjunctivae normal.     Pupils: Pupils are equal, round, and reactive to light.  Neck:     Musculoskeletal: Normal range of motion.  Pulmonary:     Effort: Pulmonary effort is normal. No respiratory distress.     Comments: Speaking in full sentences Musculoskeletal: Normal range of motion.  Skin:    Coloration: Skin is not jaundiced or pale.     Findings: No bruising, erythema, lesion or rash.  Neurological:     Mental Status: She is alert and oriented to person, place, and time. Mental status is at baseline.  Psychiatric:        Mood and Affect: Mood normal.        Behavior: Behavior normal.        Thought Content: Thought content normal.        Judgment: Judgment normal.     Results for orders placed or performed in visit on 12/18/17  Microscopic Examination   URINE  Result Value Ref Range   WBC, UA 0-5 0 - 5 /hpf   RBC, UA 0-2 0 -  2 /hpf   Epithelial Cells (non renal) 0-10 0 - 10 /hpf   Bacteria, UA None seen None seen/Few  CBC with Differential/Platelet  Result Value Ref Range   WBC 6.8 3.4 - 10.8 x10E3/uL   RBC 4.20 3.77 - 5.28 x10E6/uL   Hemoglobin 13.5 11.1 - 15.9 g/dL   Hematocrit 40.8 34.0 - 46.6 %   MCV 97 79 - 97 fL   MCH 32.1 26.6 - 33.0 pg   MCHC 33.1 31.5 - 35.7 g/dL   RDW 13.0 12.3 - 15.4 %   Platelets 383 (H) 150 - 379 x10E3/uL   Neutrophils 62 Not Estab. %   Lymphs 30 Not Estab. %   Monocytes 7 Not Estab. %   Eos 0 Not Estab. %   Basos 1 Not Estab. %   Neutrophils Absolute 4.2 1.4 - 7.0 x10E3/uL   Lymphocytes Absolute 2.0 0.7 - 3.1 x10E3/uL   Monocytes Absolute 0.5 0.1 - 0.9 x10E3/uL   EOS (ABSOLUTE) 0.0 0.0 - 0.4 x10E3/uL   Basophils Absolute  0.1 0.0 - 0.2 x10E3/uL   Immature Granulocytes 0 Not Estab. %   Immature Grans (Abs) 0.0 0.0 - 0.1 x10E3/uL  Comprehensive metabolic panel  Result Value Ref Range   Glucose 79 65 - 99 mg/dL   BUN 10 6 - 24 mg/dL   Creatinine, Ser 0.84 0.57 - 1.00 mg/dL   GFR calc non Af Amer 85 >59 mL/min/1.73   GFR calc Af Amer 98 >59 mL/min/1.73   BUN/Creatinine Ratio 12 9 - 23   Sodium 141 134 - 144 mmol/L   Potassium 3.8 3.5 - 5.2 mmol/L   Chloride 101 96 - 106 mmol/L   CO2 21 20 - 29 mmol/L   Calcium 9.1 8.7 - 10.2 mg/dL   Total Protein 7.6 6.0 - 8.5 g/dL   Albumin 4.4 3.5 - 5.5 g/dL   Globulin, Total 3.2 1.5 - 4.5 g/dL   Albumin/Globulin Ratio 1.4 1.2 - 2.2   Bilirubin Total 0.3 0.0 - 1.2 mg/dL   Alkaline Phosphatase 99 39 - 117 IU/L   AST 26 0 - 40 IU/L   ALT 16 0 - 32 IU/L  Lipid Panel w/o Chol/HDL Ratio  Result Value Ref Range   Cholesterol, Total 173 100 - 199 mg/dL   Triglycerides 44 0 - 149 mg/dL   HDL 68 >39 mg/dL   VLDL Cholesterol Cal 9 5 - 40 mg/dL   LDL Calculated 96 0 - 99 mg/dL  TSH  Result Value Ref Range   TSH 1.100 0.450 - 4.500 uIU/mL  UA/M w/rflx Culture, Routine   Specimen: Urine   URINE  Result Value Ref Range   Specific Gravity, UA 1.020 1.005 - 1.030   pH, UA 5.5 5.0 - 7.5   Color, UA Yellow Yellow   Appearance Ur Hazy (A) Clear   Leukocytes, UA Trace (A) Negative   Protein, UA Negative Negative/Trace   Glucose, UA Negative Negative   Ketones, UA Negative Negative   RBC, UA Trace (A) Negative   Bilirubin, UA Negative Negative   Urobilinogen, Ur 1.0 0.2 - 1.0 mg/dL   Nitrite, UA Negative Negative   Microscopic Examination See below:   VITAMIN D 25 Hydroxy (Vit-D Deficiency, Fractures)  Result Value Ref Range   Vit D, 25-Hydroxy 31.1 30.0 - 100.0 ng/mL  IGP, Aptima HPV, rfx 16/18,45  Result Value Ref Range   DIAGNOSIS: Comment    Specimen adequacy: Comment    Clinician Provided ICD10 Comment  Performed by: Comment    Electronically signed by:  Comment    PAP Smear Comment .    Note: Comment    Test Methodology Comment    HPV Aptima Negative Negative      Assessment & Plan:   Problem List Items Addressed This Visit    None    Visit Diagnoses    Acute midline thoracic back pain    -  Primary   WIll treat with voltaren, flexeril and exercices. Call if not getting better or getting worse. Continue to monitor.    Relevant Medications   cyclobenzaprine (FLEXERIL) 10 MG tablet       Follow up plan: Return if symptoms worsen or fail to improve.   . This visit was completed via [employer] due to the restrictions of the COVID-19 pandemic. All issues as above were discussed and addressed. Physical exam was done as above through visual confirmation on [employer]. If it was felt that the patient should be evaluated in the office, they were directed there. The patient verbally consented to this visit. . Location of the patient: home . Location of the provider: home . Those involved with this call:  . Provider: Park Liter, DO . CMA: Tiffany Reel, CMA . Front Desk/Registration: Don Perking  . Time spent on call: 15 minutes with patient face to face via video conference. More than 50% of this time was spent in counseling and coordination of care. 23 minutes total spent in review of patient's record and preparation of their chart.

## 2019-11-22 ENCOUNTER — Telehealth: Payer: Self-pay | Admitting: Family Medicine

## 2019-11-22 NOTE — Telephone Encounter (Signed)
Copied from [place] [phone]. Topic: General - Inquiry >> Nov 22, 2019  7:50 AM Scherrie Gerlach wrote: Reason for CRM: pt needs another work note saying she can come back to work today, 11/22/19.  Pt states she had one that was to RTW on Thursday, but she was not feeling well enough Ok to [employer]

## 2020-01-24 ENCOUNTER — Telehealth: Payer: Self-pay | Admitting: Family Medicine

## 2020-01-24 NOTE — Telephone Encounter (Signed)
OK- please make appointment

## 2020-01-24 NOTE — Telephone Encounter (Signed)
Where? Need more information

## 2020-01-24 NOTE — Telephone Encounter (Signed)
Copied from [place] [id number]. Topic: General - Other >> Jan 24, 2020 10:04 AM Yvette Rack wrote: Reason for CRM: Pt stated she needs to have a cortisone shot. Pt would like call back

## 2020-01-24 NOTE — Telephone Encounter (Signed)
Called ane=d spoke with patient, she states that she would like it done for her elbow, tennis elbow, right. Last shot was over a year ago

## 2020-01-27 ENCOUNTER — Encounter: Payer: Self-pay | Admitting: Family Medicine

## 2020-01-27 ENCOUNTER — Other Ambulatory Visit: Payer: Self-pay

## 2020-01-27 ENCOUNTER — Ambulatory Visit (INDEPENDENT_AMBULATORY_CARE_PROVIDER_SITE_OTHER): Payer: No Typology Code available for payment source | Admitting: Family Medicine

## 2020-01-27 VITALS — BP 124/83 | HR 64 | Temp 98.6°F

## 2020-01-27 DIAGNOSIS — B353 Tinea pedis: Secondary | ICD-10-CM | POA: Diagnosis not present

## 2020-01-27 DIAGNOSIS — M7711 Lateral epicondylitis, right elbow: Secondary | ICD-10-CM | POA: Diagnosis not present

## 2020-01-27 MED ORDER — CLOTRIMAZOLE-BETAMETHASONE 1-0.05 % EX CREA: 1.0000 "application " | TOPICAL_CREAM | Freq: Two times a day (BID) | CUTANEOUS | 3 refills | Status: DC

## 2020-01-27 NOTE — Progress Notes (Signed)
BP 124/83   Pulse 64   Temp 98.6 F (37 C)   SpO2 100%    Subjective:    Patient ID: Bailey Perry, female    DOB: [birth year], 44 y.o.   MRN: [id number]  HPI: Bailey Perry is a 44 y.o. female  Chief Complaint  Patient presents with  . Elbow Pain  . Rash    between last two toes right foot, white in color and painful   ARM PAIN Duration: 2-3 months Location: right elbow Mechanism of injury: unknown Onset: gradual Severity: severe  Quality:  aching Frequency: constant Radiation: radiates into hand Aggravating factors:lifting, twisting   Alleviating factors: nothing  Status: worse Treatments attempted: steroid shot   Relief with NSAIDs?:  mild Swelling: yes Redness: no  Warmth: no Trauma: no Chest pain: no  Shortness of breath: no  Fever: no Decreased sensation: no Paresthesias: yes Weakness: yes   RASH Duration:  months  Location: between 4th and 5th toes on the R foot  Itching: yes Burning: yes Redness: no Oozing: no Scaling: no Blisters: no Painful: no Fevers: no Change in detergents/soaps/personal care products: no Recent illness: no Recent travel:no History of same: yes Context: stable Alleviating factors: lotion/moisturizer Treatments attempted:OTC anit-fungal and lotion/moisturizer Shortness of breath: no  Throat/tongue swelling: no Myalgias/arthralgias: no   Relevant past medical, surgical, family and social history reviewed and updated as indicated. Interim medical history since our last visit reviewed. Allergies and medications reviewed and updated.  Review of Systems  Constitutional: Negative.   Respiratory: Negative.   Cardiovascular: Negative.   Gastrointestinal: Negative.   Musculoskeletal: Positive for arthralgias and myalgias. Negative for back pain, gait problem, joint swelling, neck pain and neck stiffness.  Skin: Negative.     Per HPI unless specifically indicated above     Objective:    BP 124/83    Pulse 64   Temp 98.6 F (37 C)   SpO2 100%   Wt Readings from Last 3 Encounters:  04/08/19 223 lb (101.2 kg)  10/16/18 223 lb 3.2 oz (101.2 kg)  07/06/18 222 lb 1 oz (100.7 kg)    Physical Exam Vitals and nursing note reviewed.  Constitutional:      General: She is not in acute distress.    Appearance: Normal appearance. She is not ill-appearing, toxic-appearing or diaphoretic.  HENT:     Head: Normocephalic and atraumatic.     Right Ear: External ear normal.     Left Ear: External ear normal.     Nose: Nose normal.     Mouth/Throat:     Mouth: Mucous membranes are moist.     Pharynx: Oropharynx is clear.  Eyes:     General: No scleral icterus.       Right eye: No discharge.        Left eye: No discharge.     Extraocular Movements: Extraocular movements intact.     Conjunctiva/sclera: Conjunctivae normal.     Pupils: Pupils are equal, round, and reactive to light.  Cardiovascular:     Rate and Rhythm: Normal rate and regular rhythm.     Pulses: Normal pulses.     Heart sounds: Normal heart sounds. No murmur. No friction rub. No gallop.   Pulmonary:     Effort: Pulmonary effort is normal. No respiratory distress.     Breath sounds: Normal breath sounds. No stridor. No wheezing, rhonchi or rales.  Chest:     Chest wall: No tenderness.  Musculoskeletal:  General: Tenderness (R lateral epicondyle) present. Normal range of motion.     Cervical back: Normal range of motion and neck supple.  Skin:    General: Skin is warm and dry.     Capillary Refill: Capillary refill takes less than 2 seconds.     Coloration: Skin is not jaundiced or pale.     Findings: Rash (hypopigmented rash between 4th and 5th toes on the R) present. No bruising, erythema or lesion.  Neurological:     General: No focal deficit present.     Mental Status: She is alert and oriented to person, place, and time. Mental status is at baseline.  Psychiatric:        Mood and Affect: Mood normal.         Behavior: Behavior normal.        Thought Content: Thought content normal.        Judgment: Judgment normal.     Results for orders placed or performed in visit on 12/18/17  Microscopic Examination   URINE  Result Value Ref Range   WBC, UA 0-5 0 - 5 /hpf   RBC, UA 0-2 0 - 2 /hpf   Epithelial Cells (non renal) 0-10 0 - 10 /hpf   Bacteria, UA None seen None seen/Few  CBC with Differential/Platelet  Result Value Ref Range   WBC 6.8 3.4 - 10.8 x10E3/uL   RBC 4.20 3.77 - 5.28 x10E6/uL   Hemoglobin 13.5 11.1 - 15.9 g/dL   Hematocrit 40.8 34.0 - 46.6 %   MCV 97 79 - 97 fL   MCH 32.1 26.6 - 33.0 pg   MCHC 33.1 31.5 - 35.7 g/dL   RDW 13.0 12.3 - 15.4 %   Platelets 383 (H) 150 - 379 x10E3/uL   Neutrophils 62 Not Estab. %   Lymphs 30 Not Estab. %   Monocytes 7 Not Estab. %   Eos 0 Not Estab. %   Basos 1 Not Estab. %   Neutrophils Absolute 4.2 1.4 - 7.0 x10E3/uL   Lymphocytes Absolute 2.0 0.7 - 3.1 x10E3/uL   Monocytes Absolute 0.5 0.1 - 0.9 x10E3/uL   EOS (ABSOLUTE) 0.0 0.0 - 0.4 x10E3/uL   Basophils Absolute 0.1 0.0 - 0.2 x10E3/uL   Immature Granulocytes 0 Not Estab. %   Immature Grans (Abs) 0.0 0.0 - 0.1 x10E3/uL  Comprehensive metabolic panel  Result Value Ref Range   Glucose 79 65 - 99 mg/dL   BUN 10 6 - 24 mg/dL   Creatinine, Ser 0.84 0.57 - 1.00 mg/dL   GFR calc non Af Amer 85 >59 mL/min/1.73   GFR calc Af Amer 98 >59 mL/min/1.73   BUN/Creatinine Ratio 12 9 - 23   Sodium 141 134 - 144 mmol/L   Potassium 3.8 3.5 - 5.2 mmol/L   Chloride 101 96 - 106 mmol/L   CO2 21 20 - 29 mmol/L   Calcium 9.1 8.7 - 10.2 mg/dL   Total Protein 7.6 6.0 - 8.5 g/dL   Albumin 4.4 3.5 - 5.5 g/dL   Globulin, Total 3.2 1.5 - 4.5 g/dL   Albumin/Globulin Ratio 1.4 1.2 - 2.2   Bilirubin Total 0.3 0.0 - 1.2 mg/dL   Alkaline Phosphatase 99 39 - 117 IU/L   AST 26 0 - 40 IU/L   ALT 16 0 - 32 IU/L  Lipid Panel w/o Chol/HDL Ratio  Result Value Ref Range   Cholesterol, Total 173 100 - 199 mg/dL    Triglycerides 44 0 - 149 mg/dL  HDL 68 >39 mg/dL   VLDL Cholesterol Cal 9 5 - 40 mg/dL   LDL Calculated 96 0 - 99 mg/dL  TSH  Result Value Ref Range   TSH 1.100 0.450 - 4.500 uIU/mL  UA/M w/rflx Culture, Routine   Specimen: Urine   URINE  Result Value Ref Range   Specific Gravity, UA 1.020 1.005 - 1.030   pH, UA 5.5 5.0 - 7.5   Color, UA Yellow Yellow   Appearance Ur Hazy (A) Clear   Leukocytes, UA Trace (A) Negative   Protein, UA Negative Negative/Trace   Glucose, UA Negative Negative   Ketones, UA Negative Negative   RBC, UA Trace (A) Negative   Bilirubin, UA Negative Negative   Urobilinogen, Ur 1.0 0.2 - 1.0 mg/dL   Nitrite, UA Negative Negative   Microscopic Examination See below:   VITAMIN D 25 Hydroxy (Vit-D Deficiency, Fractures)  Result Value Ref Range   Vit D, 25-Hydroxy 31.1 30.0 - 100.0 ng/mL  IGP, Aptima HPV, rfx 16/18,45  Result Value Ref Range   DIAGNOSIS: Comment    Specimen adequacy: Comment    Clinician Provided ICD10 Comment    Performed by: Comment    Electronically signed by: Comment    PAP Smear Comment .    Note: Comment    Test Methodology Comment    HPV Aptima Negative Negative      Assessment & Plan:   Problem List Items Addressed This Visit    None    Visit Diagnoses    Lateral epicondylitis of right elbow    -  Primary   Failing conservative therapy. Will give steroid injection- if not better in a few days, will refer to otrho given that it keeps coming back.    Tinea pedis of right foot       Will treat with lotrisone. Call with any concerns. Continue to monitor.    Relevant Medications   clotrimazole-betamethasone (LOTRISONE) cream      Procedure: Right Lateral Epicondylitis Steroid Injection        Diagnosis:   ICD-10-CM   1. Lateral epicondylitis of right elbow  M77.11    Failing conservative therapy. Will give steroid injection- if not better in a few days, will refer to otrho given that it keeps coming back.   2. Tinea  pedis of right foot  B35.3    Will treat with lotrisone. Call with any concerns. Continue to monitor.     Physician: MJ Consent:  Risks, benefits, and alternative treatments discussed and all questions were answered.  Patient elected to proceed and verbal consent obtained.  Description: Area prepped and draped using  semi-sterile technique.  After identifying area of maximal tenderness, A mixture of 2cc 1% lidocaine and 0.5cc Kenalog 40 was injected.  A bandage was then placed over the injection site. Complications: none Post Procedure Instructions: Wound care instructions discussed and patient was instructed to keep area clean and dry.  Signs and symptoms of infection discussed, patient agrees to contact the office ASAP should they occur.  Follow Up: Return if symptoms worsen or fail to improve.   Follow up plan: Return if symptoms worsen or fail to improve.

## 2020-03-27 ENCOUNTER — Telehealth: Payer: Self-pay | Admitting: Family Medicine

## 2020-03-27 DIAGNOSIS — M7711 Lateral epicondylitis, right elbow: Secondary | ICD-10-CM

## 2020-03-27 NOTE — Telephone Encounter (Signed)
Copied from [place] [id number]. Topic: General - Other >> Mar 27, 2020 12:09 PM Leward Quan A wrote: Reason for CRM: Patient called to inform Dr Wynetta Emery that the last visit with her elbow she was informed that if it was not getting any better to call back and a referral will be sent to a specialist. So patient is asking If Dr Wynetta Emery can please refer her so that this issue can be worked on or resolved. Please advise Ph# [phone]

## 2020-03-27 NOTE — Telephone Encounter (Signed)
Referral placed. They'll call her

## 2020-03-27 NOTE — Telephone Encounter (Signed)
Called patient and left detailed message (DPR reviewed) about Dr. Durenda Age message. Asked pt to return call if have any questions about the message.

## 2020-09-21 ENCOUNTER — Telehealth: Payer: Self-pay

## 2020-09-21 NOTE — Telephone Encounter (Signed)
Copied from [place] [phone]. Topic: General - Inquiry >> Sep 21, 2020 11:45 AM Gillis Ends D wrote: Reason for CRM: Patient called and wanted to get a prescription called in for an irritation under her arm. She needs something for the rash and then if possible a deodorant. She can be reached at [phone]. Please advise

## 2020-09-22 ENCOUNTER — Ambulatory Visit: Payer: Self-pay

## 2020-09-22 NOTE — Telephone Encounter (Signed)
Pt. Reports she has had a rash to her underarms for 2-3 weeks. Seems to be related to her deodorant. Has changed brands, but it still continues. States the rash looks discolored and itches. Pt. Prefers not to come in. "Can't she just call something in." No availability with Dr. Wynetta Emery until next week, Declines to see a different provider.Please advise pt.  Reason for Disposition  [1] Looks infected (spreading redness, pus) AND [2] no fever  Answer Assessment - Initial Assessment Questions 1. APPEARANCE of RASH: "Describe the rash."      Discolored 2. LOCATION: "Where is the rash located?"      Under arms 3. NUMBER: "How many spots are there?"      n/a 4. SIZE: "How big are the spots?" (Inches, centimeters or compare to size of a coin)      n/a 5. ONSET: "When did the rash start?"      2-3 weeks ago 6. ITCHING: "Does the rash itch?" If Yes, ask: "How bad is the itch?"  (Scale 1-10; or mild, moderate, severe)     Moderate 7. PAIN: "Does the rash hurt?" If Yes, ask: "How bad is the pain?"  (Scale 1-10; or mild, moderate, severe)     No 8. OTHER SYMPTOMS: "Do you have any other symptoms?" (e.g., fever)     No 9. PREGNANCY: "Is there any chance you are pregnant?" "When was your last menstrual period?"     No  Protocols used: RASH OR REDNESS - LOCALIZED-A-AH

## 2020-09-22 NOTE — Telephone Encounter (Signed)
Left pt.a message to call back to review her symptoms.

## 2020-09-22 NOTE — Telephone Encounter (Signed)
Would need to be seen for this, do recommend appointment for further assessment to determine medication needed to treat.

## 2020-09-23 NOTE — Telephone Encounter (Signed)
Pt declined apt with other provider to be seen sooner, pt states she would call if she felt she needed to be seen, Pt at the moment stated she did not want a appointment.

## 2020-12-09 ENCOUNTER — Encounter: Payer: Self-pay | Admitting: Family Medicine

## 2020-12-09 ENCOUNTER — Other Ambulatory Visit: Payer: Self-pay

## 2020-12-09 ENCOUNTER — Ambulatory Visit (INDEPENDENT_AMBULATORY_CARE_PROVIDER_SITE_OTHER): Payer: No Typology Code available for payment source | Admitting: Family Medicine

## 2020-12-09 VITALS — BP 114/80 | HR 76 | Temp 98.0°F | Ht 62.5 in | Wt 215.0 lb

## 2020-12-09 DIAGNOSIS — Z1211 Encounter for screening for malignant neoplasm of colon: Secondary | ICD-10-CM

## 2020-12-09 DIAGNOSIS — E559 Vitamin D deficiency, unspecified: Secondary | ICD-10-CM

## 2020-12-09 DIAGNOSIS — Z0000 Encounter for general adult medical examination without abnormal findings: Secondary | ICD-10-CM

## 2020-12-09 DIAGNOSIS — Z23 Encounter for immunization: Secondary | ICD-10-CM | POA: Diagnosis not present

## 2020-12-09 DIAGNOSIS — Z1231 Encounter for screening mammogram for malignant neoplasm of breast: Secondary | ICD-10-CM

## 2020-12-09 DIAGNOSIS — Z8249 Family history of ischemic heart disease and other diseases of the circulatory system: Secondary | ICD-10-CM

## 2020-12-09 DIAGNOSIS — E669 Obesity, unspecified: Secondary | ICD-10-CM

## 2020-12-09 NOTE — Patient Instructions (Addendum)
Call to schedule your mammogram  [facility]  Address: [address], [place], [place] [address]  Phone: [phone]   Health Maintenance, Female Adopting a healthy lifestyle and getting preventive care are important in promoting health and wellness. Ask your health care provider about:  The right schedule for you to have regular tests and exams.  Things you can do on your own to prevent diseases and keep yourself healthy. What should I know about diet, weight, and exercise? Eat a healthy diet   Eat a diet that includes plenty of vegetables, fruits, low-fat dairy products, and lean protein.  Do not eat a lot of foods that are high in solid fats, added sugars, or sodium. Maintain a healthy weight Body mass index (BMI) is used to identify weight problems. It estimates body fat based on height and weight. Your health care provider can help determine your BMI and help you achieve or maintain a healthy weight. Get regular exercise Get regular exercise. This is one of the most important things you can do for your health. Most adults should:  Exercise for at least 150 minutes each week. The exercise should increase your heart rate and make you sweat (moderate-intensity exercise).  Do strengthening exercises at least twice a week. This is in addition to the moderate-intensity exercise.  Spend less time sitting. Even light physical activity can be beneficial. Watch cholesterol and blood lipids Have your blood tested for lipids and cholesterol at 46 years of age, then have this test every 5 years. Have your cholesterol levels checked more often if:  Your lipid or cholesterol levels are high.  You are older than 46 years of age.  You are at high risk for heart disease. What should I know about cancer screening? Depending on your health history and family history, you may need to have cancer screening at various ages. This may include screening  for:  Breast cancer.  Cervical cancer.  Colorectal cancer.  Skin cancer.  Lung cancer. What should I know about heart disease, diabetes, and high blood pressure? Blood pressure and heart disease  High blood pressure causes heart disease and increases the risk of stroke. This is more likely to develop in people who have high blood pressure readings, are of African descent, or are overweight.  Have your blood pressure checked: ? Every 3-5 years if you are 18-39 years of age. ? Every year if you are 40 years old or older. Diabetes Have regular diabetes screenings. This checks your fasting blood sugar level. Have the screening done:  Once every three years after age 40 if you are at a normal weight and have a low risk for diabetes.  More often and at a younger age if you are overweight or have a high risk for diabetes. What should I know about preventing infection? Hepatitis B If you have a higher risk for hepatitis B, you should be screened for this virus. Talk with your health care provider to find out if you are at risk for hepatitis B infection. Hepatitis C Testing is recommended for:  Everyone born from 1945 through 1965.  Anyone with known risk factors for hepatitis C. Sexually transmitted infections (STIs)  Get screened for STIs, including gonorrhea and chlamydia, if: ? You are sexually active and are younger than 46 years of age. ? You are older than 46 years of age and your health care provider tells you that you are at risk for this type of   infection. ? Your sexual activity has changed since you were last screened, and you are at increased risk for chlamydia or gonorrhea. Ask your health care provider if you are at risk.  Ask your health care provider about whether you are at high risk for HIV. Your health care provider may recommend a prescription medicine to help prevent HIV infection. If you choose to take medicine to prevent HIV, you should first get tested for HIV.  You should then be tested every 3 months for as long as you are taking the medicine. Pregnancy  If you are about to stop having your period (premenopausal) and you may become pregnant, seek counseling before you get pregnant.  Take 400 to 800 micrograms (mcg) of folic acid every day if you become pregnant.  Ask for birth control (contraception) if you want to prevent pregnancy. Osteoporosis and menopause Osteoporosis is a disease in which the bones lose minerals and strength with aging. This can result in bone fractures. If you are 65 years old or older, or if you are at risk for osteoporosis and fractures, ask your health care provider if you should:  Be screened for bone loss.  Take a calcium or vitamin D supplement to lower your risk of fractures.  Be given hormone replacement therapy (HRT) to treat symptoms of menopause. Follow these instructions at home: Lifestyle  Do not use any products that contain nicotine or tobacco, such as cigarettes, e-cigarettes, and chewing tobacco. If you need help quitting, ask your health care provider.  Do not use street drugs.  Do not share needles.  Ask your health care provider for help if you need support or information about quitting drugs. Alcohol use  Do not drink alcohol if: ? Your health care provider tells you not to drink. ? You are pregnant, may be pregnant, or are planning to become pregnant.  If you drink alcohol: ? Limit how much you use to 0-1 drink a day. ? Limit intake if you are breastfeeding.  Be aware of how much alcohol is in your drink. In the [place] one drink equals one 12 oz bottle of beer (355 mL), one 5 oz glass of wine (148 mL), or one 1 oz glass of hard liquor (44 mL). General instructions  Schedule regular health, dental, and eye exams.  Stay current with your vaccines.  Tell your health care provider if: ? You often feel depressed. ? You have ever been abused or do not feel safe at  home. Summary  Adopting a healthy lifestyle and getting preventive care are important in promoting health and wellness.  Follow your health care provider's instructions about healthy diet, exercising, and getting tested or screened for diseases.  Follow your health care provider's instructions on monitoring your cholesterol and blood pressure. This information is not intended to replace advice given to you by your health care provider. Make sure you discuss any questions you have with your health care provider. Document Revised: 11/28/2018 Document Reviewed: 11/28/2018 Elsevier Patient Education  2020 [employer]  

## 2020-12-09 NOTE — Progress Notes (Signed)
BP 114/80   Pulse 76   Temp 98 F (36.7 C)   Ht 5' 2.5" (1.588 m)   Wt 215 lb (97.5 kg)   SpO2 99%   BMI 38.70 kg/m    Subjective:    Patient ID: Bailey Perry, female    DOB: 1976/08/01, 44 y.o.   MRN: [id number]  HPI: Bailey Perry is a 44 y.o. female presenting on 12/09/2020 for comprehensive medical examination. Current medical complaints include: Notes that her mom had a MI and died at 35- she has GERD, but would like to see cardiology just to make sure everything is OK  Menopausal Symptoms: yes- having night sweats  Depression Screen done today and results listed below:  Depression screen [facility] 2/9 12/09/2020 11/20/2019 10/17/2017 09/19/2017 10/26/2015  Decreased Interest 0 0 0 2 0  Down, Depressed, Hopeless 0 0 0 3 0  PHQ - 2 Score 0 0 0 5 0  Altered sleeping - - 0 3 -  Tired, decreased energy - - 0 3 -  Change in appetite - - 0 0 -  Feeling bad or failure about yourself  - - 0 1 -  Trouble concentrating - - 0 0 -  Moving slowly or fidgety/restless - - 0 0 -  Suicidal thoughts - - 0 0 -  PHQ-9 Score - - 0 12 -    Past Medical History:  Past Medical History:  Diagnosis Date  . Abnormal Pap smear of cervix     Surgical History:  Past Surgical History:  Procedure Laterality Date  . APPENDECTOMY    . CESAREAN SECTION     X 2    Medications:  Current Outpatient Medications on File Prior to Visit  Medication Sig  . clotrimazole-betamethasone (LOTRISONE) cream Apply 1 application topically 2 (two) times daily.  Marland Kitchen levonorgestrel (MIRENA) 20 MCG/24HR IUD 1 Intra Uterine Device (1 each total) by Intrauterine route once.  Marland Kitchen aluminum-magnesium hydroxide-simethicone (MAALOX) I037812 MG/5ML SUSP Take 30 mLs by mouth 4 (four) times daily -  before meals and at bedtime. (Patient not taking: Reported on 12/09/2020)  . cyclobenzaprine (FLEXERIL) 10 MG tablet Take 1 tablet (10 mg total) by mouth 3 (three) times daily as needed for muscle spasms. (Patient not  taking: Reported on 12/09/2020)  . diclofenac Sodium (VOLTAREN) 1 % GEL Apply 4 g topically 4 (four) times daily. (Patient not taking: Reported on 12/09/2020)  . pantoprazole (PROTONIX) 40 MG tablet Take by mouth. (Patient not taking: Reported on 12/09/2020)   No current facility-administered medications on file prior to visit.    Allergies:  Allergies  Allergen Reactions  . Iodine Itching  . Omeprazole Rash  . Shellfish Allergy Rash    Social History:  Social History   Socioeconomic History  . Marital status: Married    Spouse name: Not on file  . Number of children: Not on file  . Years of education: Not on file  . Highest education level: Not on file  Occupational History  . Not on file  Tobacco Use  . Smoking status: Never Smoker  . Smokeless tobacco: Never Used  Vaping Use  . Vaping Use: Never used  Substance and Sexual Activity  . Alcohol use: No  . Drug use: No  . Sexual activity: Yes    Birth control/protection: I.U.D.  Other Topics Concern  . Not on file  Social History Narrative  . Not on file   Social Determinants of Health   Financial Resource Strain: Not  on file  Food Insecurity: Not on file  Transportation Needs: Not on file  Physical Activity: Not on file  Stress: Not on file  Social Connections: Not on file  Intimate Partner Violence: Not on file   Social History   Tobacco Use  Smoking Status Never Smoker  Smokeless Tobacco Never Used   Social History   Substance and Sexual Activity  Alcohol Use No    Family History:  Family History  Problem Relation Age of Onset  . Diabetes Mother   . Heart disease Mother   . Hypertension Mother   . Cancer Father        Lymphoma  . Arthritis Paternal Grandfather     Past medical history, surgical history, medications, allergies, family history and social history reviewed with patient today and changes made to appropriate areas of the chart.   Review of Systems  Constitutional: Positive for  diaphoresis. Negative for chills, fever, malaise/fatigue and weight loss.  HENT: Negative.   Eyes: Negative.   Respiratory: Negative.   Cardiovascular: Positive for leg swelling (with being on her feet or eating a lot of salt). Negative for chest pain, palpitations, orthopnea, claudication and PND.  Gastrointestinal: Positive for heartburn. Negative for abdominal pain, blood in stool, constipation, diarrhea, melena, nausea and vomiting.  Genitourinary: Negative.   Musculoskeletal: Negative.   Skin: Positive for rash (better now). Negative for itching.  Neurological: Negative.   Endo/Heme/Allergies: Negative.   Psychiatric/Behavioral: Negative.     All other ROS negative except what is listed above and in the HPI.      Objective:    BP 114/80   Pulse 76   Temp 98 F (36.7 C)   Ht 5' 2.5" (1.588 m)   Wt 215 lb (97.5 kg)   SpO2 99%   BMI 38.70 kg/m   Wt Readings from Last 3 Encounters:  12/09/20 215 lb (97.5 kg)  04/08/19 223 lb (101.2 kg)  10/16/18 223 lb 3.2 oz (101.2 kg)    Physical Exam Vitals and nursing note reviewed.  Constitutional:      General: She is not in acute distress.    Appearance: Normal appearance. She is not ill-appearing, toxic-appearing or diaphoretic.  HENT:     Head: Normocephalic and atraumatic.     Right Ear: Tympanic membrane, ear canal and external ear normal. There is no impacted cerumen.     Left Ear: Tympanic membrane, ear canal and external ear normal. There is no impacted cerumen.     Nose: Nose normal. No congestion or rhinorrhea.     Mouth/Throat:     Mouth: Mucous membranes are moist.     Pharynx: Oropharynx is clear. No oropharyngeal exudate or posterior oropharyngeal erythema.  Eyes:     General: No scleral icterus.       Right eye: No discharge.        Left eye: No discharge.     Extraocular Movements: Extraocular movements intact.     Conjunctiva/sclera: Conjunctivae normal.     Pupils: Pupils are equal, round, and reactive to  light.  Neck:     Vascular: No carotid bruit.  Cardiovascular:     Rate and Rhythm: Normal rate and regular rhythm.     Pulses: Normal pulses.     Heart sounds: No murmur heard. No friction rub. No gallop.   Pulmonary:     Effort: Pulmonary effort is normal. No respiratory distress.     Breath sounds: Normal breath sounds. No stridor. No wheezing, rhonchi  or rales.  Chest:     Chest wall: No tenderness.  Abdominal:     General: Abdomen is flat. Bowel sounds are normal. There is no distension.     Palpations: Abdomen is soft. There is no mass.     Tenderness: There is no abdominal tenderness. There is no right CVA tenderness, left CVA tenderness, guarding or rebound.     Hernia: No hernia is present.  Genitourinary:    Comments: Breast and pelvic exams deferred with shared decision making Musculoskeletal:        General: No swelling, tenderness, deformity or signs of injury.     Cervical back: Normal range of motion and neck supple. No rigidity. No muscular tenderness.     Right lower leg: No edema.     Left lower leg: No edema.  Lymphadenopathy:     Cervical: No cervical adenopathy.  Skin:    General: Skin is warm and dry.     Capillary Refill: Capillary refill takes less than 2 seconds.     Coloration: Skin is not jaundiced or pale.     Findings: No bruising, erythema, lesion or rash.  Neurological:     General: No focal deficit present.     Mental Status: She is alert and oriented to person, place, and time. Mental status is at baseline.     Cranial Nerves: No cranial nerve deficit.     Sensory: No sensory deficit.     Motor: No weakness.     Coordination: Coordination normal.     Gait: Gait normal.     Deep Tendon Reflexes: Reflexes normal.  Psychiatric:        Mood and Affect: Mood normal.        Behavior: Behavior normal.        Thought Content: Thought content normal.        Judgment: Judgment normal.     Results for orders placed or performed in visit on  12/18/17  Microscopic Examination   URINE  Result Value Ref Range   WBC, UA 0-5 0 - 5 /hpf   RBC, UA 0-2 0 - 2 /hpf   Epithelial Cells (non renal) 0-10 0 - 10 /hpf   Bacteria, UA None seen None seen/Few  CBC with Differential/Platelet  Result Value Ref Range   WBC 6.8 3.4 - 10.8 x10E3/uL   RBC 4.20 3.77 - 5.28 x10E6/uL   Hemoglobin 13.5 11.1 - 15.9 g/dL   Hematocrit 40.8 34.0 - 46.6 %   MCV 97 79 - 97 fL   MCH 32.1 26.6 - 33.0 pg   MCHC 33.1 31.5 - 35.7 g/dL   RDW 13.0 12.3 - 15.4 %   Platelets 383 (H) 150 - 379 x10E3/uL   Neutrophils 62 Not Estab. %   Lymphs 30 Not Estab. %   Monocytes 7 Not Estab. %   Eos 0 Not Estab. %   Basos 1 Not Estab. %   Neutrophils Absolute 4.2 1.4 - 7.0 x10E3/uL   Lymphocytes Absolute 2.0 0.7 - 3.1 x10E3/uL   Monocytes Absolute 0.5 0.1 - 0.9 x10E3/uL   EOS (ABSOLUTE) 0.0 0.0 - 0.4 x10E3/uL   Basophils Absolute 0.1 0.0 - 0.2 x10E3/uL   Immature Granulocytes 0 Not Estab. %   Immature Grans (Abs) 0.0 0.0 - 0.1 x10E3/uL  Comprehensive metabolic panel  Result Value Ref Range   Glucose 79 65 - 99 mg/dL   BUN 10 6 - 24 mg/dL   Creatinine, Ser 0.84 0.57 - 1.00 mg/dL  GFR calc non Af Amer 85 >59 mL/min/1.73   GFR calc Af Amer 98 >59 mL/min/1.73   BUN/Creatinine Ratio 12 9 - 23   Sodium 141 134 - 144 mmol/L   Potassium 3.8 3.5 - 5.2 mmol/L   Chloride 101 96 - 106 mmol/L   CO2 21 20 - 29 mmol/L   Calcium 9.1 8.7 - 10.2 mg/dL   Total Protein 7.6 6.0 - 8.5 g/dL   Albumin 4.4 3.5 - 5.5 g/dL   Globulin, Total 3.2 1.5 - 4.5 g/dL   Albumin/Globulin Ratio 1.4 1.2 - 2.2   Bilirubin Total 0.3 0.0 - 1.2 mg/dL   Alkaline Phosphatase 99 39 - 117 IU/L   AST 26 0 - 40 IU/L   ALT 16 0 - 32 IU/L  Lipid Panel w/o Chol/HDL Ratio  Result Value Ref Range   Cholesterol, Total 173 100 - 199 mg/dL   Triglycerides 44 0 - 149 mg/dL   HDL 68 >03 mg/dL   VLDL Cholesterol Cal 9 5 - 40 mg/dL   LDL Calculated 96 0 - 99 mg/dL  TSH  Result Value Ref Range   TSH 1.100  0.450 - 4.500 uIU/mL  UA/M w/rflx Culture, Routine   Specimen: Urine   URINE  Result Value Ref Range   Specific Gravity, UA 1.020 1.005 - 1.030   pH, UA 5.5 5.0 - 7.5   Color, UA Yellow Yellow   Appearance Ur Hazy (A) Clear   Leukocytes, UA Trace (A) Negative   Protein, UA Negative Negative/Trace   Glucose, UA Negative Negative   Ketones, UA Negative Negative   RBC, UA Trace (A) Negative   Bilirubin, UA Negative Negative   Urobilinogen, Ur 1.0 0.2 - 1.0 mg/dL   Nitrite, UA Negative Negative   Microscopic Examination See below:   VITAMIN D 25 Hydroxy (Vit-D Deficiency, Fractures)  Result Value Ref Range   Vit D, 25-Hydroxy 31.1 30.0 - 100.0 ng/mL  IGP, Aptima HPV, rfx 16/18,45  Result Value Ref Range   DIAGNOSIS: Comment    Specimen adequacy: Comment    Clinician Provided ICD10 Comment    Performed by: Comment    Electronically signed by: Comment    PAP Smear Comment .    Note: Comment    Test Methodology Comment    HPV Aptima Negative Negative      Assessment & Plan:   Problem List Items Addressed This Visit      Other   Vitamin D deficiency    Recheckin labs today. Await results. Treat as needed.       Relevant Orders   VITAMIN D 25 Hydroxy (Vit-D Deficiency, Fractures)   Obesity (BMI 35.0-39.9 without comorbidity)    Congratulated patient on weight loss! Continue diet and exercise. Call with any concerns.        Other Visit Diagnoses    Routine general medical examination at a health care facility    -  Primary   Vaccines up to date. Screening labs checked today. Pap up to date. Mammogram and colonoscopy ordered. Call with any concerns. Continue diet and exercise.    Relevant Orders   CBC with Differential/Platelet   Comprehensive metabolic panel   Lipid Panel w/o Chol/HDL Ratio   TSH   Urinalysis, Routine w reflex microscopic   Hepatitis C Antibody   Family history of cardiac disorder in mother       Referral to cardiology placed today. Call with any  concerns.    Relevant Orders   Ambulatory  referral to Cardiology   Screening for colon cancer       Referral generated today.   Relevant Orders   Ambulatory referral to Gastroenterology   Encounter for screening mammogram for malignant neoplasm of breast       Mammogram ordered today.   Need for influenza vaccination       Flu shot given today.   Relevant Orders   Flu Vaccine QUAD 6+ mos PF IM (Fluarix Quad PF) (Completed)       Follow up plan: Return in about 1 year (around 12/09/2021) for physical.   LABORATORY TESTING:  - Pap smear: up to date  IMMUNIZATIONS:   - Tdap: Tetanus vaccination status reviewed: last tetanus booster within 10 years. - Influenza: Administered today - Pneumovax: Not applicable - COVID: Up to date  SCREENING: -Mammogram: Ordered today  - Colonoscopy: Ordered today   PATIENT COUNSELING:   Advised to take 1 mg of folate supplement per day if capable of pregnancy.   Sexuality: Discussed sexually transmitted diseases, partner selection, use of condoms, avoidance of unintended pregnancy  and contraceptive alternatives.   Advised to avoid cigarette smoking.  I discussed with the patient that most people either abstain from alcohol or drink within safe limits (<=14/week and <=4 drinks/occasion for males, <=7/weeks and <= 3 drinks/occasion for females) and that the risk for alcohol disorders and other health effects rises proportionally with the number of drinks per week and how often a drinker exceeds daily limits.  Discussed cessation/primary prevention of drug use and availability of treatment for abuse.   Diet: Encouraged to adjust caloric intake to maintain  or achieve ideal body weight, to reduce intake of dietary saturated fat and total fat, to limit sodium intake by avoiding high sodium foods and not adding table salt, and to maintain adequate dietary potassium and calcium preferably from fresh fruits, vegetables, and low-fat dairy products.     stressed the importance of regular exercise  Injury prevention: Discussed safety belts, safety helmets, smoke detector, smoking near bedding or upholstery.   Dental health: Discussed importance of regular tooth brushing, flossing, and dental visits.    NEXT PREVENTATIVE PHYSICAL DUE IN 1 YEAR. Return in about 1 year (around 12/09/2021) for physical.

## 2020-12-09 NOTE — Assessment & Plan Note (Signed)
Congratulated patient on weight loss! Continue diet and exercise. Call with any concerns.

## 2020-12-09 NOTE — Assessment & Plan Note (Signed)
Recheckin labs today. Await results. Treat as needed.

## 2020-12-10 LAB — COMPREHENSIVE METABOLIC PANEL
ALT: 15 IU/L (ref 0–32)
AST: 23 IU/L (ref 0–40)
Albumin/Globulin Ratio: 1.3 (ref 1.2–2.2)
Albumin: 4.1 g/dL (ref 3.8–4.8)
Alkaline Phosphatase: 104 IU/L (ref 44–121)
BUN/Creatinine Ratio: 13 (ref 9–23)
BUN: 11 mg/dL (ref 6–24)
Bilirubin Total: 0.4 mg/dL (ref 0.0–1.2)
CO2: 22 mmol/L (ref 20–29)
Calcium: 9.1 mg/dL (ref 8.7–10.2)
Chloride: 103 mmol/L (ref 96–106)
Creatinine, Ser: 0.87 mg/dL (ref 0.57–1.00)
GFR calc Af Amer: 92 mL/min/{1.73_m2} (ref >59)
GFR calc non Af Amer: 80 mL/min/{1.73_m2} (ref >59)
Globulin, Total: 3.1 g/dL (ref 1.5–4.5)
Glucose: 73 mg/dL (ref 65–99)
Potassium: 4.4 mmol/L (ref 3.5–5.2)
Sodium: 138 mmol/L (ref 134–144)
Total Protein: 7.2 g/dL (ref 6.0–8.5)

## 2020-12-10 LAB — URINALYSIS, ROUTINE W REFLEX MICROSCOPIC
Bilirubin, UA: NEGATIVE
Glucose, UA: NEGATIVE
Ketones, UA: NEGATIVE
Leukocytes,UA: NEGATIVE
Nitrite, UA: NEGATIVE
Protein,UA: NEGATIVE
RBC, UA: NEGATIVE
Specific Gravity, UA: 1.025 (ref 1.005–1.030)
Urobilinogen, Ur: 4 mg/dL — ABNORMAL HIGH (ref 0.2–1.0)
pH, UA: 6.5 (ref 5.0–7.5)

## 2020-12-10 LAB — LIPID PANEL W/O CHOL/HDL RATIO
Cholesterol, Total: 168 mg/dL (ref 100–199)
HDL: 61 mg/dL (ref >39)
LDL Chol Calc (NIH): 97 mg/dL (ref 0–99)
Triglycerides: 51 mg/dL (ref 0–149)
VLDL Cholesterol Cal: 10 mg/dL (ref 5–40)

## 2020-12-10 LAB — CBC WITH DIFFERENTIAL/PLATELET
Basophils Absolute: 0.1 10*3/uL (ref 0.0–0.2)
Basos: 1 %
EOS (ABSOLUTE): 0 10*3/uL (ref 0.0–0.4)
Eos: 1 %
Hematocrit: 39.9 % (ref 34.0–46.6)
Hemoglobin: 13.6 g/dL (ref 11.1–15.9)
Immature Grans (Abs): 0 10*3/uL (ref 0.0–0.1)
Immature Granulocytes: 0 %
Lymphocytes Absolute: 2.3 10*3/uL (ref 0.7–3.1)
Lymphs: 32 %
MCH: 32.7 pg (ref 26.6–33.0)
MCHC: 34.1 g/dL (ref 31.5–35.7)
MCV: 96 fL (ref 79–97)
Monocytes Absolute: 0.7 10*3/uL (ref 0.1–0.9)
Monocytes: 10 %
Neutrophils Absolute: 4 10*3/uL (ref 1.4–7.0)
Neutrophils: 56 %
Platelets: 317 10*3/uL (ref 150–450)
RBC: 4.16 x10E6/uL (ref 3.77–5.28)
RDW: 12.2 % (ref 11.7–15.4)
WBC: 7 10*3/uL (ref 3.4–10.8)

## 2020-12-10 LAB — HEPATITIS C ANTIBODY: Hep C Virus Ab: <0.1 s/co ratio (ref 0.0–0.9)

## 2020-12-10 LAB — TSH: TSH: 1.48 u[IU]/mL (ref 0.450–4.500)

## 2020-12-10 LAB — VITAMIN D 25 HYDROXY (VIT D DEFICIENCY, FRACTURES): Vit D, 25-Hydroxy: 12.7 ng/mL — ABNORMAL LOW (ref 30.0–100.0)

## 2020-12-11 ENCOUNTER — Other Ambulatory Visit: Payer: Self-pay | Admitting: Family Medicine

## 2020-12-11 MED ORDER — VITAMIN D (ERGOCALCIFEROL) 1.25 MG (50000 UNIT) PO CAPS: 50000.0000 [IU] | ORAL_CAPSULE | ORAL | 1 refills | Status: DC

## 2020-12-24 ENCOUNTER — Telehealth: Payer: Self-pay

## 2020-12-24 NOTE — Telephone Encounter (Signed)
Copied from CRM [id number]. Topic: General - Other >> Dec 24, 2020  4:19 PM Gwenlyn Fudge wrote: Reason for CRM: Pt called and is requesting to have a call back to discuss why vitamin D was sent in for her. Please advise.

## 2020-12-24 NOTE — Telephone Encounter (Signed)
Called and discussed with patient. Mychart result note was not viewed by the patient from 12/11/20.   Patient asked about a medication for night sweats. She states that Dr. Laural Benes advised her to take something at her appointment but she cannot remember what. I did not see it in the note.

## 2020-12-24 NOTE — Telephone Encounter (Signed)
Black cohash, evening primrose oil or estroven. Thanks!

## 2020-12-25 NOTE — Telephone Encounter (Signed)
Patient notified

## 2021-01-10 NOTE — Progress Notes (Signed)
Cardiology Office Note  Date:  01/11/2021   ID:  Bailey Perry, Bailey Perry Sep 17, 1976, MRN [id number]  PCP:  Bailey Roys, DO   Chief Complaint  Patient presents with  . New Patient (Initial Visit)    Referred by PCP for Family Hx. Patient c.o of Chest cramps. Meds reviewed verbally with patient.     HPI:  Bailey Perry is a 45 year old woman with past medical history of GERD Obesity Family history of coronary artery disease Who presents by referral from Bailey Perry for consultation of her family history/discussion of risk factors  She reports significant history of GERD Previous evaluated by GI, upper GI series documenting small sliding hiatal hernia with small volume reflux to the midesophagus with maneuvers  Initially started on omeprazole though this caused facial swelling and rash  Protonix: did not work as well, but ok  Has hiatal hernia Small, sliding hiatal hernia with small volume reflux to the midesophagus with provocation maneuvers.  Active, 3 children ages 51 and 2 age 25 Working full-time, Denies chest pain shortness of breath on exertion  On discussion of her family history,  mom had a MI and died at 44 Was a smoker with diabetes  Dad with no cardiac issue History of skin cancer  EKG personally reviewed by myself on todays visit Shows normal sinus rhythm rate 67 bpm no significant ST-T wave changes   Lab Results  Component Value Date   CHOL 168 12/09/2020   HDL 61 12/09/2020   [place] 97 12/09/2020   TRIG 51 12/09/2020    PMH:   has a past medical history of Abnormal Pap smear of cervix.  PSH:    Past Surgical History:  Procedure Laterality Date  . APPENDECTOMY    . CESAREAN SECTION     X 2    Current Outpatient Medications  Medication Sig Dispense Refill  . levonorgestrel (MIRENA) 20 MCG/24HR IUD 1 Intra Uterine Device (1 each total) by Intrauterine route once. 1 each 0  . Vitamin D, Ergocalciferol, (DRISDOL) 1.25 MG (50000  UNIT) CAPS capsule Take 1 capsule (50,000 Units total) by mouth every 7 (seven) days. 12 capsule 1   No current facility-administered medications for this visit.     Allergies:   Iodine, Omeprazole, and Shellfish allergy   Social History:  The patient  reports that she has never smoked. She has never used smokeless tobacco. She reports that she does not drink alcohol and does not use drugs.   Family History:   family history includes Arthritis in her paternal grandfather; Cancer in her father; Diabetes in her mother; Heart disease in her mother; Hypertension in her mother.    Review of Systems: Review of Systems  Constitutional: Negative.   HENT: Negative.   Respiratory: Negative.   Cardiovascular: Negative.   Gastrointestinal: Negative.   Musculoskeletal: Negative.   Neurological: Negative.   Psychiatric/Behavioral: Negative.   All other systems reviewed and are negative.   PHYSICAL EXAM: VS:  BP 124/74 (BP Location: Right Arm, Patient Position: Sitting, Cuff Size: Normal)   Pulse 67   Ht 5\' 2"  (1.575 m)   Wt 220 lb (99.8 kg)   SpO2 98%   BMI 40.24 kg/m  , BMI Body mass index is 40.24 kg/m. GEN: Well nourished, well developed, in no acute distress HEENT: normal Neck: no JVD, carotid bruits, or masses Cardiac: RRR; no murmurs, rubs, or gallops,no edema  Respiratory:  clear to auscultation bilaterally, normal work of breathing GI: soft, nontender, nondistended, +  BS MS: no deformity or atrophy Skin: warm and dry, no rash Neuro:  Strength and sensation are intact Psych: euthymic mood, full affect    Recent Labs: 12/09/2020: ALT 15; BUN 11; Creatinine, Ser 0.87; Hemoglobin 13.6; Platelets 317; Potassium 4.4; Sodium 138; TSH 1.480    Lipid Panel Lab Results  Component Value Date   CHOL 168 12/09/2020   HDL 61 12/09/2020   LDLCALC 97 12/09/2020   TRIG 51 12/09/2020      Wt Readings from Last 3 Encounters:  01/11/21 220 lb (99.8 kg)  12/09/20 215 lb (97.5 kg)   04/08/19 223 lb (101.2 kg)       ASSESSMENT AND PLAN:  Problem List Items Addressed This Visit    Gastroesophageal reflux disease    Other Visit Diagnoses    Family history of premature coronary artery disease    -  Primary   Atypical chest pain       Hiatal hernia         Atypical chest pain,  likely musculoskeletal in nature No further work-up at this time  Family history of premature coronary disease Mother was diabetic, smoker, Bailey Perry does not have these risk factors and relatively well-controlled cholesterol on no medication No further work-up at this time but we did discuss CT coronary calcium scoring if she wanted further risk stratification She will call us if she would like this ordered  Hiatal hernia Discussed GERD symptoms Symptoms stable on pantoprazole For breakthrough symptoms may need Tums/Pepcid or may need to double up on the pantoprazole to twice daily dosing Discussed raising head of bed  Obesity Would recommend exercise, careful diet management in an effort to lose weight.   Total encounter time more than 60 minutes  Greater than 50% was spent in counseling and coordination of care with the patient  Patient was seen in consultation for [facility] and will be referred back to her clinic for follow-up of the issues detailed above  Signed, Esmond Plants, M.D., Ph.D. [place], [place]

## 2021-01-11 ENCOUNTER — Encounter: Payer: Self-pay | Admitting: Cardiovascular Disease

## 2021-01-11 ENCOUNTER — Other Ambulatory Visit: Payer: Self-pay

## 2021-01-11 ENCOUNTER — Ambulatory Visit (INDEPENDENT_AMBULATORY_CARE_PROVIDER_SITE_OTHER): Payer: PRIVATE HEALTH INSURANCE | Admitting: Cardiovascular Disease

## 2021-01-11 VITALS — BP 124/74 | HR 67 | Ht 62.0 in | Wt 220.0 lb

## 2021-01-11 DIAGNOSIS — Z8249 Family history of ischemic heart disease and other diseases of the circulatory system: Secondary | ICD-10-CM | POA: Diagnosis not present

## 2021-01-11 DIAGNOSIS — R0789 Other chest pain: Secondary | ICD-10-CM

## 2021-01-11 DIAGNOSIS — K219 Gastro-esophageal reflux disease without esophagitis: Secondary | ICD-10-CM | POA: Diagnosis not present

## 2021-01-11 DIAGNOSIS — K449 Diaphragmatic hernia without obstruction or gangrene: Secondary | ICD-10-CM

## 2021-01-11 NOTE — Patient Instructions (Signed)
Read about CT coronary calcium score If you would like one, just call the offic  Medication Instructions:  No changes  If you need a refill on your cardiac medications before your next appointment, please call your pharmacy.    Lab work: No new labs needed   If you have labs (blood work) drawn today and your tests are completely normal, you will receive your results only by: Marland Kitchen MyChart Message (if you have MyChart) OR . A paper copy in the mail If you have any lab test that is abnormal or we need to change your treatment, we will call you to review the results.   Testing/Procedures: No new testing needed   Follow-Up: At [facility], you and your health needs are our priority.  As part of our continuing mission to provide you with exceptional heart care, we have created designated Provider Care Teams.  These Care Teams include your primary Cardiologist (physician) and Advanced Practice Providers (APPs -  Physician Assistants and Nurse Practitioners) who all work together to provide you with the care you need, when you need it.  . You will need a follow up appointment as needed  . Providers on your designated Care Team:   . Bailey Hodgkins, NP . Bailey Faith, PA-C . Bailey Mood, PA-C  Any Other Special Instructions Will Be Listed Below (If Applicable).  COVID-19 Vaccine Information can be found at: ShippingScam.co.uk For questions related to vaccine distribution or appointments, please email vaccine@[email or web address] or call [phone].

## 2021-01-18 ENCOUNTER — Emergency Department
Admission: EM | Admit: 2021-01-18 | Discharge: 2021-01-18 | Disposition: A | Discharge status: Home / Self Care | Payer: PRIVATE HEALTH INSURANCE | Attending: Emergency Medicine | Admitting: Emergency Medicine

## 2021-01-18 ENCOUNTER — Other Ambulatory Visit: Payer: Self-pay

## 2021-01-18 ENCOUNTER — Ambulatory Visit: Payer: Self-pay

## 2021-01-18 ENCOUNTER — Emergency Department: Payer: PRIVATE HEALTH INSURANCE

## 2021-01-18 DIAGNOSIS — Z20822 Contact with and (suspected) exposure to covid-19: Secondary | ICD-10-CM | POA: Diagnosis not present

## 2021-01-18 DIAGNOSIS — R079 Chest pain, unspecified: Secondary | ICD-10-CM | POA: Diagnosis present

## 2021-01-18 DIAGNOSIS — R0789 Other chest pain: Secondary | ICD-10-CM | POA: Insufficient documentation

## 2021-01-18 DIAGNOSIS — M549 Dorsalgia, unspecified: Secondary | ICD-10-CM | POA: Insufficient documentation

## 2021-01-18 HISTORY — DX: Diaphragmatic hernia without obstruction or gangrene: K44.9

## 2021-01-18 LAB — CBC
HCT: 40 % (ref 36.0–46.0)
Hemoglobin: 13.5 g/dL (ref 12.0–15.0)
MCH: 32.4 pg (ref 26.0–34.0)
MCHC: 33.8 g/dL (ref 30.0–36.0)
MCV: 95.9 fL (ref 80.0–100.0)
Platelets: 334 10*3/uL (ref 150–400)
RBC: 4.17 MIL/uL (ref 3.87–5.11)
RDW: 12.1 % (ref 11.5–15.5)
WBC: 5.3 10*3/uL (ref 4.0–10.5)
nRBC: 0 % (ref 0.0–0.2)

## 2021-01-18 LAB — BASIC METABOLIC PANEL
Anion gap: 5 (ref 5–15)
BUN: 11 mg/dL (ref 6–20)
CO2: 28 mmol/L (ref 22–32)
Calcium: 9.3 mg/dL (ref 8.9–10.3)
Chloride: 105 mmol/L (ref 98–111)
Creatinine, Ser: 0.75 mg/dL (ref 0.44–1.00)
GFR, Estimated: >60 mL/min (ref >60)
Glucose, Bld: 87 mg/dL (ref 70–99)
Potassium: 3.8 mmol/L (ref 3.5–5.1)
Sodium: 138 mmol/L (ref 135–145)

## 2021-01-18 LAB — TROPONIN I (HIGH SENSITIVITY): Troponin I (High Sensitivity): <2 ng/L (ref <18)

## 2021-01-18 MED ORDER — IOHEXOL 350 MG/ML SOLN
75.0000 mL | Freq: Once | INTRAVENOUS | Status: AC | PRN
  Administered 2021-01-18: 75 mL via INTRAVENOUS
  Filled 2021-01-18: qty 75

## 2021-01-18 MED ORDER — KETOROLAC TROMETHAMINE 30 MG/ML IJ SOLN
30.0000 mg | Freq: Once | INTRAMUSCULAR | Status: AC
  Administered 2021-01-18: 30 mg via INTRAVENOUS
  Filled 2021-01-18: qty 1

## 2021-01-18 MED ORDER — NAPROXEN 500 MG PO TABS: 500.0000 mg | ORAL_TABLET | Freq: Two times a day (BID) | ORAL | 2 refills | Status: DC

## 2021-01-18 MED ORDER — METHOCARBAMOL 500 MG PO TABS: 500.0000 mg | ORAL_TABLET | Freq: Three times a day (TID) | ORAL | 0 refills | Status: DC

## 2021-01-18 NOTE — Telephone Encounter (Signed)
Pt. Reports she has had chest pain x 2 days. Hurts in the center of chest. Pain radiates into her neck, shoulders and back. Has some shortness of breath. Will have her husband take her to ED now.Instructed to call EMS if symptoms worsen.  Reason for Disposition . Pain also in shoulder(s) or arm(s) or jaw (Exception: pain is clearly made worse by movement)  Answer Assessment - Initial Assessment Questions 1. LOCATION: "Where does it hurt?"       Middle 2. RADIATION: "Does the pain go anywhere else?" (e.g., into neck, jaw, arms, back)     Neck and back and both shoulders 3. ONSET: "When did the chest pain begin?" (Minutes, hours or days)      2 days 4. PATTERN "Does the pain come and go, or has it been constant since it started?"  "Does it get worse with exertion?"      Constant 5. DURATION: "How long does it last" (e.g., seconds, minutes, hours)     2 days 6. SEVERITY: "How bad is the pain?"  (e.g., Scale 1-10; mild, moderate, or severe)    - MILD (1-3): doesn't interfere with normal activities     - MODERATE (4-7): interferes with normal activities or awakens from sleep    - SEVERE (8-10): excruciating pain, unable to do any normal activities       7 7. CARDIAC RISK FACTORS: "Do you have any history of heart problems or risk factors for heart disease?" (e.g., angina, prior heart attack; diabetes, high blood pressure, high cholesterol, smoker, or strong family history of heart disease)     Family history 8. PULMONARY RISK FACTORS: "Do you have any history of lung disease?"  (e.g., blood clots in lung, asthma, emphysema, birth control pills)     No 9. CAUSE: "What do you think is causing the chest pain?"     Unsure 10. OTHER SYMPTOMS: "Do you have any other symptoms?" (e.g., dizziness, nausea, vomiting, sweating, fever, difficulty breathing, cough)       Shortness of breath 11. PREGNANCY: "Is there any chance you are pregnant?" "When was your last menstrual period?"       No  Protocols  used: CHEST PAIN-A-AH

## 2021-01-18 NOTE — ED Provider Notes (Signed)
[facility] Emergency Department Provider Note   ____________________________________________    I have reviewed the triage vital signs and the nursing notes.   HISTORY  Chief Complaint Chest Pain     HPI Bailey Perry is a 45 y.o. female who presents with complaints of back discomfort as well as chest discomfort.  She describes as an aching sensation.  It started within the last 1 to 2 days and became severe today.  Denies cough, congestion, fevers or chills.  She has received Covid vaccines and booster.  No sick contacts reported.  No calf pain or swelling.  No history of PE.  Is not take anything for this.  No nausea vomiting or diaphoresis.  Past Medical History:  Diagnosis Date  . Abnormal Pap smear of cervix   . Hiatal hernia     Patient Active Problem List   Diagnosis Date Noted  . Chronic constipation 12/18/2017  . Obesity (BMI 35.0-39.9 without comorbidity) 10/17/2017  . Gastroesophageal reflux disease 09/23/2017  . Vitamin D deficiency 09/22/2017  . Migraine with aura and without status migrainosus, not intractable 10/26/2015    Past Surgical History:  Procedure Laterality Date  . APPENDECTOMY    . CESAREAN SECTION     X 2    Prior to Admission medications   Medication Sig Start Date End Date Taking? Authorizing Provider  methocarbamol (ROBAXIN) 500 MG tablet Take 1 tablet (500 mg total) by mouth 3 (three) times daily. 01/18/21  Yes Lavonia Drafts, MD  naproxen (NAPROSYN) 500 MG tablet Take 1 tablet (500 mg total) by mouth 2 (two) times daily with a meal. 01/18/21  Yes Lavonia Drafts, MD  levonorgestrel (MIRENA) 20 MCG/24HR IUD 1 Intra Uterine Device (1 each total) by Intrauterine route once. 05/09/16   Shambley, Melody N, CNM  Vitamin D, Ergocalciferol, (DRISDOL) 1.25 MG (50000 UNIT) CAPS capsule Take 1 capsule (50,000 Units total) by mouth every 7 (seven) days. 12/11/20   Park Liter P, DO     Allergies Betadine  [povidone iodine], Omeprazole, and Shellfish allergy  Family History  Problem Relation Age of Onset  . Diabetes Mother   . Heart disease Mother   . Hypertension Mother   . Cancer Father        Lymphoma  . Arthritis Paternal Grandfather     Social History Social History   Tobacco Use  . Smoking status: Never Smoker  . Smokeless tobacco: Never Used  Vaping Use  . Vaping Use: Never used  Substance Use Topics  . Alcohol use: No  . Drug use: No    Review of Systems  Constitutional: No fever/chills Eyes: No visual changes.  ENT: No sore throat. Cardiovascular: As above Respiratory: Denies shortness of breath. Gastrointestinal: No abdominal pain.  No nausea, no vomiting.   Genitourinary: Negative for dysuria. Musculoskeletal: As above Skin: Negative for rash. Neurological: Negative for headaches or weakness   ____________________________________________   PHYSICAL EXAM:  VITAL SIGNS: ED Triage Vitals  Enc Vitals Group     BP 01/18/21 0922 132/62     Pulse Rate 01/18/21 0922 67     Resp 01/18/21 0922 18     Temp 01/18/21 0922 98.6 F (37 C)     Temp Source 01/18/21 0922 Oral     SpO2 01/18/21 0922 99 %     Weight 01/18/21 0923 101.2 kg (223 lb)     Height 01/18/21 0923 1.575 m (5\' 2" )     Head Circumference --  Peak Flow --      Pain Score 01/18/21 0923 7     Pain Loc --      Pain Edu? --      Excl. in [place]? --     Constitutional: Alert and oriented.  Eyes: Conjunctivae are normal.  Head: Atraumatic. Nose: No congestion/rhinnorhea. Mouth/Throat: Mucous membranes are moist.   Neck:  Painless ROM Cardiovascular: Normal rate, regular rhythm. Kermit Balo peripheral circulation. Respiratory: Normal respiratory effort.  No retractions. Lungs CTAB. Gastrointestinal: Soft and nontender. No distention.  N  Musculoskeletal: No lower extremity tenderness nor edema.  Warm and well perfused Neurologic:  Normal speech and language. No gross focal neurologic deficits  are appreciated.  Skin:  Skin is warm, dry and intact. No rash noted. Psychiatric: Mood and affect are normal. Speech and behavior are normal.  ____________________________________________   LABS (all labs ordered are listed, but only abnormal results are displayed)  Labs Reviewed  SARS CORONAVIRUS 2 (TAT 6-24 HRS)  BASIC METABOLIC PANEL  CBC  POC URINE PREG, ED  TROPONIN I (HIGH SENSITIVITY)   ____________________________________________  EKG  ED ECG REPORT I, Lavonia Drafts, the attending physician, personally viewed and interpreted this ECG.  Date: 01/18/2021  Rhythm: normal sinus rhythm QRS Axis: normal Intervals: normal ST/T Wave abnormalities: normal Narrative Interpretation: no evidence of acute ischemia  ____________________________________________  RADIOLOGY  Chest x-ray reviewed by me, no infiltrate or effusion CT angiography no PE, groundglass opacity suspicious for pneumonia ____________________________________________   PROCEDURES  Procedure(s) performed: No  Procedures   Critical Care performed: No ____________________________________________   INITIAL IMPRESSION / ASSESSMENT AND PLAN / ED COURSE  Pertinent labs & imaging results that were available during my care of the patient were reviewed by me and considered in my medical decision making (see chart for details).  Patient presents with upper back pain as well as atypical chest pain.  Low risk for ACS, EKG is reassuring, high-sensitivity troponin is normal.  Question PE given back and chest pain slightly worse with deep inhalation.  No risk factors for PE.  No fevers chills or cough to suggest COVID-19  Sent for CT angiography, negative for PE, groundglass opacity suspicious for possible Covid, Covid PCR sent.  Patient's vitals are quite reassuring, will discharge on NSAIDs, muscle relaxants, pending Covid swab    ____________________________________________   FINAL CLINICAL  IMPRESSION(S) / ED DIAGNOSES  Final diagnoses:  Atypical chest pain        Note:  This document was prepared using Dragon voice recognition software and may include unintentional dictation errors.   Lavonia Drafts, MD 01/18/21 [phone]

## 2021-01-18 NOTE — ED Notes (Signed)
Pt transported to CT ?

## 2021-01-18 NOTE — Discharge Instructions (Signed)
Pending covid test

## 2021-01-18 NOTE — ED Triage Notes (Signed)
Pt c/o pain in the upper back between the shoulders for the past week, states today having chest pain and SOB, states she called her PCP for possible covid testing and they referred her to the ED. Pt is in NAD at present. Ambulatory to triage with no distress noted.

## 2021-01-19 ENCOUNTER — Ambulatory Visit (INDEPENDENT_AMBULATORY_CARE_PROVIDER_SITE_OTHER): Payer: No Typology Code available for payment source | Admitting: Family Medicine

## 2021-01-19 ENCOUNTER — Other Ambulatory Visit: Payer: Self-pay

## 2021-01-19 ENCOUNTER — Encounter: Payer: Self-pay | Admitting: Family Medicine

## 2021-01-19 DIAGNOSIS — M546 Pain in thoracic spine: Secondary | ICD-10-CM

## 2021-01-19 DIAGNOSIS — K1120 Sialoadenitis, unspecified: Secondary | ICD-10-CM | POA: Diagnosis not present

## 2021-01-19 DIAGNOSIS — M549 Dorsalgia, unspecified: Secondary | ICD-10-CM | POA: Insufficient documentation

## 2021-01-19 LAB — SARS CORONAVIRUS 2 (TAT 6-24 HRS): SARS Coronavirus 2: NEGATIVE

## 2021-01-19 NOTE — Assessment & Plan Note (Signed)
Likely contrast-induced given prior sensitivity, onset within hours of contrast exposure and lack of constitutional symptoms to suggest infection, malignancy. Is UTD with vaccinations, unlikely mumps. No dry mouth and bilateral, unlikely obstruction/stone. Recommend warm compresses and NSAIDs. F/u if persistent past a few days or worsening, consider formal imaging at that time.

## 2021-01-19 NOTE — Assessment & Plan Note (Signed)
Likely MSK in etiology. No red flags on history or exam. Continue prn NSAID, muscle relaxer. Handout on stretches provided. Can consider PT referral if ongoing. F/u prn.

## 2021-01-19 NOTE — Patient Instructions (Signed)
It was great to see you!  Our plans for today:  - Try the muscle relaxer for your back pain. - Your swelling of your salivary glad should go down with time. Let us know if it is persistent past a week. Take naproxen or ibuprofen as needed and make sure to stay hydrated.  Take care and seek immediate care sooner if you develop any concerns.   Dr. Ky Barban   . Lie on your back. Bend your right knee so that your right foot is flat on the floor. Bailey Perry your left leg over your right so that your left ankle rests on your right knee. . Use your hands to grab hold of your left knee and pull it gently toward the opposite shoulder. You should feel the stretch in your buttocks and hips. . Hold for 15 to 30 seconds. . Relax, and then repeat with the other leg. . Repeat this cycle 2 to 4 times.   Lie on your back in a doorway, with one leg through the open door. Slide your leg up the wall to straighten your knee. You should feel a gentle stretch down the back of your leg. Hold the stretch for at least 1 minute. As the days go by, add a little more time until you can relax and let these muscles stretch for as much as 6 minutes for each leg. Do not arch your back. Do not bend either knee. Keep one heel touching the floor and the other heel touching the wall. Do not point your toes. Repeat with your other leg. Do 2 to 4 times for each leg. If you do not have a place to do this exercise in a doorway, there is another way to do it:  Lie on your back and bend the knee of the leg you want to stretch. Loop a towel under the ball and toes of that foot, and hold the ends of the towel in your hands. Straighten your knee and slowly pull back on the towel. You should feel a gentle stretch down the back of your leg. It is hard to hold this stretch with a towel for a long time, but hold the stretch for at least 15 to 30 seconds. One minute or more is even better. Repeat with your other leg. Do 2 to 4 times for  each leg.   Child's Pose 1. Kneel on the floor with your toes together and your knees hip-width apart. Rest your palms on top of your thighs. 2. On an exhale, lower your torso between your knees. Extend your arms alongside your torso with your palms facing down. Relax your shoulders toward the ground. Rest in the pose for as long as needed.

## 2021-01-19 NOTE — Progress Notes (Signed)
    SUBJECTIVE:   CHIEF COMPLAINT / HPI:   Patient Active Problem List   Diagnosis Date Noted  . Back pain 01/19/2021  . Sialadenitis 01/19/2021  . Chronic constipation 12/18/2017  . Obesity (BMI 35.0-39.9 without comorbidity) 10/17/2017  . Gastroesophageal reflux disease 09/23/2017  . Vitamin D deficiency 09/22/2017  . Migraine with aura and without status migrainosus, not intractable 10/26/2015   ER FOLLOW UP Time since discharge: 1 day Hospital/facility: [facility] Diagnosis: atypical chest pain Procedures/tests:  - EKG NSR - CBC, BMP, trop wnl. - COVID neg - CXR nl - CTA PE neg Consultants:  New medications:  - robaxin, naproxen Discharge instructions:   Status: better - started in back last week, went into chest Saturday. Had trouble breathing yesterday, went to ED. toradol helped. - took naproxen last night, went to sleep, unsure if helped. - back pain persistent. Chest pain improved. No trouble breathing.  - no fevers.  BACK PAIN Duration: 1 week Mechanism of injury: no trauma Location: midline Severity: moderate Frequency: constant Radiation: none Aggravating factors: movement, laying and prolonged sitting Alleviating factors: toradol Status: stable Treatments attempted: heat, APAP, ibuprofen and aleve  Relief with NSAIDs?: relieved with toradol Nighttime pain:  yes Paresthesias / decreased sensation:  no Bowel / bladder incontinence:  no Fevers:  no Dysuria / urinary frequency:  no  Lump under neck - noticed since early this morning.  - painful to touch - no fevers, SOB, issues swallowing or eating. - denies dry mouth - no FH of autoimmune issues - UTD with vaccinations.   OBJECTIVE:   BP 122/85   Pulse 69   Temp 99 F (37.2 C) (Oral)   Wt 220 lb 6.4 oz (100 kg)   LMP 01/17/2021 (Exact Date)   SpO2 98%   BMI 40.31 kg/m   Gen: well appearing, in NAD HEENT: enlarged submandibular salivary glands bilaterally, TTP. MMM. Oropharynx clear without  erythema or exudate. No cervical or submandibular lymphadenopathy. Parotid and sublingual glands wnl and nonTTP. Card: RRR Lungs: CTAB MSK: TTP of thoracic musculature. Full AROM without pain. No stepoffs or acute bony tenderness. NonTTP over piriformis. Patellar reflexes symmetric. 5/5 LE strength.  Bedside US performed of bilateral submandibular glands, no stones appreciated.  ASSESSMENT/PLAN:   Back pain Likely MSK in etiology. No red flags on history or exam. Continue prn NSAID, muscle relaxer. Handout on stretches provided. Can consider PT referral if ongoing. F/u prn.  Sialadenitis Likely contrast-induced given prior sensitivity, onset within hours of contrast exposure and lack of constitutional symptoms to suggest infection, malignancy. Is UTD with vaccinations, unlikely mumps. No dry mouth and bilateral, unlikely obstruction/stone. Recommend warm compresses and NSAIDs. F/u if persistent past a few days or worsening, consider formal imaging at that time.     Myles Gip, DO

## 2021-05-18 ENCOUNTER — Encounter: Payer: No Typology Code available for payment source | Admitting: Certified Nurse Midwife

## 2021-05-20 ENCOUNTER — Encounter: Payer: Self-pay | Admitting: Certified Nurse Midwife

## 2021-06-02 ENCOUNTER — Encounter: Payer: Self-pay | Admitting: Certified Nurse Midwife

## 2021-06-02 ENCOUNTER — Other Ambulatory Visit: Payer: Self-pay

## 2021-06-02 ENCOUNTER — Ambulatory Visit (INDEPENDENT_AMBULATORY_CARE_PROVIDER_SITE_OTHER): Payer: No Typology Code available for payment source | Admitting: Certified Nurse Midwife

## 2021-06-02 VITALS — BP 126/83 | HR 91 | Ht 63.0 in | Wt 221.3 lb

## 2021-06-02 DIAGNOSIS — Z30432 Encounter for removal of intrauterine contraceptive device: Secondary | ICD-10-CM | POA: Diagnosis not present

## 2021-06-02 NOTE — Patient Instructions (Signed)

## 2021-06-02 NOTE — Progress Notes (Signed)
    GYNECOLOGY OFFICE PROCEDURE NOTE  Bailey Perry is a 45 y.o. G2P0 here for [place] IUD removal. No GYN concerns.  Last pap smear was on 12/18/2017 and was normal, HPV negative.  IUD Removal  Patient identified, informed consent performed, consent signed.  Patient was in the dorsal lithotomy position, normal external genitalia was noted.  A speculum was placed in the patient's vagina, normal discharge was noted, no lesions. The cervix was visualized, no lesions, no abnormal discharge.  The strings of the IUD were grasped and pulled using ring forceps. The IUD was removed in its entirety.  Patient tolerated the procedure well.    Patient will use condoms for contraception for the time being, She is considering other options. Booklet given with information . Reviewed patch, pill, ring , depo, nexplanon. She is considering pill vs patch. She will let me know via my chart message . She denies any contraindications to use of a combined method. Routine preventative health maintenance measures emphasized.  Philip Aspen, CNM

## 2021-06-02 NOTE — Progress Notes (Signed)
Pt. Presents for IUD removal and BC counseling, does not want IUD replacement. Has been having night sweats with IUD.

## 2021-09-09 ENCOUNTER — Telehealth: Payer: No Typology Code available for payment source | Admitting: Certified Nurse Midwife

## 2021-09-09 NOTE — Telephone Encounter (Signed)
Pt called stating that she was recently seen for iud removal, annie requested pt call back to let [place] know what kind of birth control she would wish to start. Pt is interested in getting started on birth control pills. Please Advise.

## 2021-09-14 ENCOUNTER — Telehealth: Payer: Self-pay | Admitting: Certified Nurse Midwife

## 2021-09-14 NOTE — Telephone Encounter (Signed)
LVM with patient to call back asap

## 2021-09-14 NOTE — Telephone Encounter (Signed)
Pt called this morning following up on call from 9-22- states that she recently had IUD removed and Deneise Lever had asked her which [facility] method she was interested in starting- she decided she would like to start a birth control pill and would like it called into her pharmacy: [facility] on [address]. Please Advise.

## 2021-09-15 ENCOUNTER — Other Ambulatory Visit: Payer: Self-pay | Admitting: Certified Nurse Midwife

## 2021-09-15 MED ORDER — NORETHIN ACE-ETH ESTRAD-FE 1-20 MG-MCG PO TABS: 1.0000 | ORAL_TABLET | Freq: Every day | ORAL | 3 refills | Status: DC

## 2021-12-16 ENCOUNTER — Encounter: Payer: Self-pay | Admitting: Family Medicine

## 2021-12-16 NOTE — Progress Notes (Deleted)
There were no vitals taken for this visit.   Subjective:    Patient ID: Bailey Perry, female    DOB: [birth year], 45 y.o.   MRN: [id number]  HPI: Bailey Perry is a 45 y.o. female presenting on 12/16/2021 for comprehensive medical examination. Current medical complaints include:{Blank single:19197::"none","***"}  She currently lives with: Menopausal Symptoms: {Blank single:19197::"yes","no"}  Depression Screen done today and results listed below:  Depression screen [facility] 2/9 12/09/2020 11/20/2019 10/17/2017 09/19/2017 10/26/2015  Decreased Interest 0 0 0 2 0  Down, Depressed, Hopeless 0 0 0 3 0  PHQ - 2 Score 0 0 0 5 0  Altered sleeping - - 0 3 -  Tired, decreased energy - - 0 3 -  Change in appetite - - 0 0 -  Feeling bad or failure about yourself  - - 0 1 -  Trouble concentrating - - 0 0 -  Moving slowly or fidgety/restless - - 0 0 -  Suicidal thoughts - - 0 0 -  PHQ-9 Score - - 0 12 -    The patient {has/does not AYTK:16010} a history of falls. I {did/did not:19850} complete a risk assessment for falls. A plan of care for falls {was/was not:19852} documented.   Past Medical History:  Past Medical History:  Diagnosis Date   Abnormal Pap smear of cervix    Hiatal hernia     Surgical History:  Past Surgical History:  Procedure Laterality Date   APPENDECTOMY     CESAREAN SECTION     X 2    Medications:  Current Outpatient Medications on File Prior to Visit  Medication Sig   norethindrone-ethinyl estradiol-FE (LOESTRIN FE) 1-20 MG-MCG tablet Take 1 tablet by mouth daily.   No current facility-administered medications on file prior to visit.    Allergies:  Allergies  Allergen Reactions   Betadine [Povidone Iodine] Swelling and Rash   Omeprazole Rash   Shellfish Allergy Rash    Social History:  Social History   Socioeconomic History   Marital status: Married    Spouse name: Not on file   Number of children: Not on file   Years of education: Not  on file   Highest education level: Not on file  Occupational History   Not on file  Tobacco Use   Smoking status: Never   Smokeless tobacco: Never  Vaping Use   Vaping Use: Never used  Substance and Sexual Activity   Alcohol use: No   Drug use: No   Sexual activity: Yes    Birth control/protection: I.U.D.  Other Topics Concern   Not on file  Social History Narrative   Not on file   Social Determinants of Health   Financial Resource Strain: Not on file  Food Insecurity: Not on file  Transportation Needs: Not on file  Physical Activity: Not on file  Stress: Not on file  Social Connections: Not on file  Intimate Partner Violence: Not on file   Social History   Tobacco Use  Smoking Status Never  Smokeless Tobacco Never   Social History   Substance and Sexual Activity  Alcohol Use No    Family History:  Family History  Problem Relation Age of Onset   Diabetes Mother    Heart disease Mother    Hypertension Mother    Cancer Father        Lymphoma   Arthritis Paternal Grandfather     Past medical history, surgical history, medications, allergies, family history and social history  reviewed with patient today and changes made to appropriate areas of the chart.   ROS All other ROS negative except what is listed above and in the HPI.      Objective:    There were no vitals taken for this visit.  Wt Readings from Last 3 Encounters:  06/02/21 221 lb 4.8 oz (100.4 kg)  01/19/21 220 lb 6.4 oz (100 kg)  01/18/21 223 lb (101.2 kg)    Physical Exam  Results for orders placed or performed during the hospital encounter of 01/18/21  SARS CORONAVIRUS 2 (TAT 6-24 HRS) Nasopharyngeal Nasopharyngeal Swab   Specimen: Nasopharyngeal Swab  Result Value Ref Range   SARS Coronavirus 2 NEGATIVE NEGATIVE  Basic metabolic panel  Result Value Ref Range   Sodium 138 135 - 145 mmol/L   Potassium 3.8 3.5 - 5.1 mmol/L   Chloride 105 98 - 111 mmol/L   CO2 28 22 - 32 mmol/L    Glucose, Bld 87 70 - 99 mg/dL   BUN 11 6 - 20 mg/dL   Creatinine, Ser 0.75 0.44 - 1.00 mg/dL   Calcium 9.3 8.9 - 10.3 mg/dL   GFR, Estimated >60 >60 mL/min   Anion gap 5 5 - 15  CBC  Result Value Ref Range   WBC 5.3 4.0 - 10.5 K/uL   RBC 4.17 3.87 - 5.11 MIL/uL   Hemoglobin 13.5 12.0 - 15.0 g/dL   HCT 40.0 36.0 - 46.0 %   MCV 95.9 80.0 - 100.0 fL   MCH 32.4 26.0 - 34.0 pg   MCHC 33.8 30.0 - 36.0 g/dL   RDW 12.1 11.5 - 15.5 %   Platelets 334 150 - 400 K/uL   nRBC 0.0 0.0 - 0.2 %  Troponin I (High Sensitivity)  Result Value Ref Range   Troponin I (High Sensitivity) <2 <18 ng/L      Assessment & Plan:   Problem List Items Addressed This Visit   None Visit Diagnoses     Routine general medical examination at a health care facility    -  Primary        Follow up plan: No follow-ups on file.   LABORATORY TESTING:  - Pap smear: {Blank YIRSWN:46270::"JJK done","not applicable","up to date","done elsewhere"}  IMMUNIZATIONS:   - Tdap: Tetanus vaccination status reviewed: {tetanus status:315746}. - Influenza: {Blank single:19197::"Up to date","Administered today","Postponed to flu season","Refused","Given elsewhere"} - Pneumovax: {Blank single:19197::"Up to date","Administered today","Not applicable","Refused","Given elsewhere"} - Prevnar: {Blank single:19197::"Up to date","Administered today","Not applicable","Refused","Given elsewhere"} - COVID: {Blank single:19197::"Up to date","Administered today","Not applicable","Refused","Given elsewhere"} - HPV: {Blank single:19197::"Up to date","Administered today","Not applicable","Refused","Given elsewhere"} - Shingrix vaccine: {Blank single:19197::"Up to date","Administered today","Not applicable","Refused","Given elsewhere"}  SCREENING: -Mammogram: {Blank single:19197::"Up to date","Ordered today","Not applicable","Refused","Done elsewhere"}  - Colonoscopy: {Blank single:19197::"Up to date","Ordered today","Not  applicable","Refused","Done elsewhere"}  - Bone Density: {Blank single:19197::"Up to date","Ordered today","Not applicable","Refused","Done elsewhere"}  -Hearing Test: {Blank single:19197::"Up to date","Ordered today","Not applicable","Refused","Done elsewhere"}  -Spirometry: {Blank single:19197::"Up to date","Ordered today","Not applicable","Refused","Done elsewhere"}   PATIENT COUNSELING:   Advised to take 1 mg of folate supplement per day if capable of pregnancy.   Sexuality: Discussed sexually transmitted diseases, partner selection, use of condoms, avoidance of unintended pregnancy  and contraceptive alternatives.   Advised to avoid cigarette smoking.  I discussed with the patient that most people either abstain from alcohol or drink within safe limits (<=14/week and <=4 drinks/occasion for males, <=7/weeks and <= 3 drinks/occasion for females) and that the risk for alcohol disorders and other health effects rises proportionally with the number  of drinks per week and how often a drinker exceeds daily limits.  Discussed cessation/primary prevention of drug use and availability of treatment for abuse.   Diet: Encouraged to adjust caloric intake to maintain  or achieve ideal body weight, to reduce intake of dietary saturated fat and total fat, to limit sodium intake by avoiding high sodium foods and not adding table salt, and to maintain adequate dietary potassium and calcium preferably from fresh fruits, vegetables, and low-fat dairy products.    stressed the importance of regular exercise  Injury prevention: Discussed safety belts, safety helmets, smoke detector, smoking near bedding or upholstery.   Dental health: Discussed importance of regular tooth brushing, flossing, and dental visits.    NEXT PREVENTATIVE PHYSICAL DUE IN 1 YEAR. No follow-ups on file.

## 2021-12-29 ENCOUNTER — Other Ambulatory Visit: Payer: Self-pay

## 2021-12-29 ENCOUNTER — Encounter: Payer: Self-pay | Admitting: Emergency Medicine

## 2021-12-29 ENCOUNTER — Emergency Department
Admission: EM | Admit: 2021-12-29 | Discharge: 2021-12-29 | Disposition: A | Discharge status: Home / Self Care | Payer: PRIVATE HEALTH INSURANCE | Attending: Emergency Medicine | Admitting: Emergency Medicine

## 2021-12-29 ENCOUNTER — Emergency Department: Payer: PRIVATE HEALTH INSURANCE

## 2021-12-29 DIAGNOSIS — M25562 Pain in left knee: Secondary | ICD-10-CM | POA: Diagnosis not present

## 2021-12-29 DIAGNOSIS — R2242 Localized swelling, mass and lump, left lower limb: Secondary | ICD-10-CM | POA: Insufficient documentation

## 2021-12-29 DIAGNOSIS — G8929 Other chronic pain: Secondary | ICD-10-CM | POA: Diagnosis not present

## 2021-12-29 MED ORDER — TRAMADOL HCL 50 MG PO TABS: 50.0000 mg | ORAL_TABLET | Freq: Four times a day (QID) | ORAL | 0 refills | Status: DC | PRN

## 2021-12-29 MED ORDER — TRAMADOL HCL 50 MG PO TABS
50.0000 mg | ORAL_TABLET | Freq: Once | ORAL | Status: AC
Start: 2021-12-29 — End: 2021-12-29
  Administered 2021-12-29: 50 mg via ORAL
  Filled 2021-12-29: qty 1

## 2021-12-29 MED ORDER — NAPROXEN 500 MG PO TABS: 500.0000 mg | ORAL_TABLET | Freq: Two times a day (BID) | ORAL | 2 refills | Status: DC

## 2021-12-29 MED ORDER — NAPROXEN 500 MG PO TABS
500.0000 mg | ORAL_TABLET | Freq: Once | ORAL | Status: AC
  Administered 2021-12-29: 500 mg via ORAL
  Filled 2021-12-29: qty 1

## 2021-12-29 NOTE — ED Triage Notes (Signed)
Pt comes into the ED via POV c/o left knee pain.  Pt states she has an old meniscus injury that is acting up.  Pt able to ambulate but has unsteady gait.  Pt in NAD.

## 2021-12-29 NOTE — ED Provider Triage Note (Signed)
Emergency Medicine Provider Triage Evaluation Note  Bailey Perry, a 46 y.o. female  was evaluated in triage.  Pt complains of left knee pain.  She reports remote left knee meniscal injury, that she notes is "acting up."  She denies any recent injury, trauma, or fall.  Review of Systems  Positive: Left knee pain  Negative: Edema, paresthesias  Physical Exam  BP 119/66    Pulse 75    Temp 98.9 F (37.2 C)    Resp 16    Ht 5\' 3"  (1.6 m)    Wt 100.4 kg    SpO2 98%    BMI 39.21 kg/m  Gen:   Awake, no distress   Resp:  Normal effort  MSK:   Moves extremities without difficulty Other:  CVS: RRR  Medical Decision Making  Medically screening exam initiated at 12:33 PM.  Appropriate orders placed.  Bailey Perry was informed that the remainder of the evaluation will be completed by another provider, this initial triage assessment does not replace that evaluation, and the importance of remaining in the ED until their evaluation is complete.  Left knee pain with history of previous injury.    Melvenia Needles, PA-C 12/29/21 1237

## 2022-01-10 NOTE — ED Provider Notes (Signed)
° °  [facility] Provider Note    Event Date/Time   First MD Initiated Contact with Patient 12/29/21 1253     (approximate)   History   Knee Pain   HPI  Bailey Perry is a 47 y.o. female who presents with complaints of left knee pain.  Patient reports that in the past she is injured her meniscus in the left knee and has been acting up recently making it more difficult for her to ambulate.  No acute trauma.  No fevers chills or redness     Physical Exam   Triage Vital Signs: ED Triage Vitals  Enc Vitals Group     BP 12/29/21 1225 119/66     Pulse Rate 12/29/21 1225 75     Resp 12/29/21 1225 16     Temp 12/29/21 1225 98.9 F (37.2 C)     Temp src --      SpO2 12/29/21 1225 98 %     Weight 12/29/21 1153 100.4 kg (221 lb 5.5 oz)     Height 12/29/21 1153 1.6 m (5\' 3" )     Head Circumference --      Peak Flow --      Pain Score 12/29/21 1153 8     Pain Loc --      Pain Edu? --      Excl. in [place]? --     Most recent vital signs: Vitals:   12/29/21 1225  BP: 119/66  Pulse: 75  Resp: 16  Temp: 98.9 F (37.2 C)  SpO2: 98%     General: Awake, no distress.  CV:  Good peripheral perfusion.  Resp:  Normal effort.  Abd:  No distention.  Other:  Left knee: Mild superior swelling, patella stable, no pain with varus or valgus stress   ED Results / Procedures / Treatments   Labs (all labs ordered are listed, but only abnormal results are displayed) Labs Reviewed - No data to display   EKG     RADIOLOGY Knee x-rays reviewed by me, no acute abnormality    PROCEDURES:  Critical Care performed:   Procedures   MEDICATIONS ORDERED IN ED: Medications  naproxen (NAPROSYN) tablet 500 mg (500 mg Oral Given 12/29/21 1319)  traMADol (ULTRAM) tablet 50 mg (50 mg Oral Given 12/29/21 1319)     IMPRESSION / MDM / ASSESSMENT AND PLAN / ED COURSE  I reviewed the triage vital signs and the nursing notes.  Patient presents with right knee  pain as detailed above, x-rays are overall unremarkable.  Exam does demonstrate some mild swelling, no evidence of infection.  Will provide analgesics, outpatient follow-up with Ortho recommended.          FINAL CLINICAL IMPRESSION(S) / ED DIAGNOSES   Final diagnoses:  Chronic pain of left knee     Rx / DC Orders   ED Discharge Orders          Ordered    naproxen (NAPROSYN) 500 MG tablet  2 times daily with meals        12/29/21 1303    traMADol (ULTRAM) 50 MG tablet  Every 6 hours PRN        12/29/21 1303             Note:  This document was prepared using Dragon voice recognition software and may include unintentional dictation errors.   Lavonia Drafts, MD 01/10/22 1810

## 2022-01-21 ENCOUNTER — Other Ambulatory Visit: Payer: Self-pay | Admitting: Student

## 2022-01-21 DIAGNOSIS — M659 Synovitis and tenosynovitis, unspecified: Secondary | ICD-10-CM

## 2022-01-21 DIAGNOSIS — S86912D Strain of unspecified muscle(s) and tendon(s) at lower leg level, left leg, subsequent encounter: Secondary | ICD-10-CM

## 2022-01-21 DIAGNOSIS — G8929 Other chronic pain: Secondary | ICD-10-CM

## 2022-01-31 ENCOUNTER — Ambulatory Visit
Admission: RE | Admit: 2022-01-31 | Discharge: 2022-01-31 | Disposition: A | Discharge status: Home / Self Care | Payer: No Typology Code available for payment source | Source: Ambulatory Visit | Attending: Student | Admitting: Student

## 2022-01-31 DIAGNOSIS — M659 Synovitis and tenosynovitis, unspecified: Secondary | ICD-10-CM

## 2022-01-31 DIAGNOSIS — M25562 Pain in left knee: Secondary | ICD-10-CM

## 2022-01-31 DIAGNOSIS — S86912D Strain of unspecified muscle(s) and tendon(s) at lower leg level, left leg, subsequent encounter: Secondary | ICD-10-CM

## 2022-01-31 DIAGNOSIS — G8929 Other chronic pain: Secondary | ICD-10-CM

## 2022-02-22 ENCOUNTER — Other Ambulatory Visit: Payer: Self-pay | Admitting: Surgery

## 2022-03-11 ENCOUNTER — Other Ambulatory Visit: Payer: Self-pay

## 2022-03-11 ENCOUNTER — Encounter: Payer: Self-pay | Admitting: *Deleted

## 2022-03-11 ENCOUNTER — Encounter
Admission: RE | Admit: 2022-03-11 | Discharge: 2022-03-11 | Disposition: A | Discharge status: Home / Self Care | Payer: No Typology Code available for payment source | Source: Ambulatory Visit | Attending: Surgery | Admitting: Surgery

## 2022-03-11 HISTORY — DX: Anemia, unspecified: D64.9

## 2022-03-11 HISTORY — DX: Gastro-esophageal reflux disease without esophagitis: K21.9

## 2022-03-11 NOTE — Patient Instructions (Signed)
Your procedure is scheduled on:03-16-22 Wednesday ?Report to the Registration Desk on the 1st floor of the [facility].Then proceed to the 2nd floor Surgery Desk in the [facility] ?To find out your arrival time, please call [phone] between 1PM - 3PM on:03-15-22 Tuesday ? ?REMEMBER: ?Instructions that are not followed completely may result in serious medical risk, up to and including death; or upon the discretion of your surgeon and anesthesiologist your surgery may need to be rescheduled. ? ?Do not eat food after midnight the night before surgery.  ?No gum chewing, lozengers or hard candies. ? ?You may however, drink CLEAR liquids up to 2 hours before you are scheduled to arrive for your surgery. Do not drink anything within 2 hours of your scheduled arrival time. ? ?Clear liquids include: ?- water  ?- apple juice without pulp ?- gatorade (not RED colors) ?- black coffee or tea (Do NOT add milk or creamers to the coffee or tea) ?Do NOT drink anything that is not on this list. ? ?In addition, your doctor has ordered for you to drink the provided  ?Ensure Pre-Surgery Clear Carbohydrate Drink  ?Drinking this carbohydrate drink up to two hours before surgery helps to reduce insulin resistance and improve patient outcomes. Please complete drinking 2 hours prior to scheduled arrival time. ? ?TAKE THESE MEDICATIONS THE MORNING OF SURGERY WITH A SIP OF WATER: ?-PRILOSEC-take one the night before and one on the morning of surgery - helps to prevent nausea after surgery.) ? ?One week prior to surgery: ?Stop Anti-inflammatories (NSAIDS) such as Advil, Aleve, Ibuprofen, Motrin, Naproxen, Naprosyn and Aspirin based products such as Excedrin, Goodys Powder, BC Powder.You may however, continue to take Tylenol if needed for pain up until the day of surgery. ? ?Stop ANY OVER THE COUNTER supplements/vitamins NOW (03-11-22) until after surgery. ? ?No Alcohol for 24 hours before or after surgery. ? ?No Smoking including  e-cigarettes for 24 hours prior to surgery.  ?No chewable tobacco products for at least 6 hours prior to surgery.  ?No nicotine patches on the day of surgery. ? ?Do not use any "recreational" drugs for at least a week prior to your surgery.  ?Please be advised that the combination of cocaine and anesthesia may have negative outcomes, up to and including death. ?If you test positive for cocaine, your surgery will be cancelled. ? ?On the morning of surgery brush your teeth with toothpaste and water, you may rinse your mouth with mouthwash if you wish. ?Do not swallow any toothpaste or mouthwash. ? ?Use CHG Soap as directed on instruction sheet. ? ?Do not wear jewelry, make-up, hairpins, clips or nail polish. ? ?Do not wear lotions, powders, or perfumes.  ? ?Do not shave body from the neck down 48 hours prior to surgery just in case you cut yourself which could leave a site for infection.  ?Also, freshly shaved skin may become irritated if using the CHG soap. ? ?Contact lenses, hearing aids and dentures may not be worn into surgery. ? ?Do not bring valuables to the hospital. [facility] is not responsible for any missing/lost belongings or valuables.  ? ?Notify your doctor if there is any change in your medical condition (cold, fever, infection). ? ?Wear comfortable clothing (specific to your surgery type) to the hospital. ? ?After surgery, you can help prevent lung complications by doing breathing exercises.  ?Take deep breaths and cough every 1-2 hours. Your doctor may order a device called an Incentive Spirometer to help you take deep  breaths. ?When coughing or sneezing, hold a pillow firmly against your incision with both hands. This is called ?splinting.? Doing this helps protect your incision. It also decreases belly discomfort. ? ?If you are being admitted to the hospital overnight, leave your suitcase in the car. ?After surgery it may be brought to your room. ? ?If you are being discharged the day of surgery,  you will not be allowed to drive home. ?You will need a responsible adult (18 years or older) to drive you home and stay with you that night.  ? ?If you are taking public transportation, you will need to have a responsible adult (18 years or older) with you. ?Please confirm with your physician that it is acceptable to use public transportation.  ? ?Please call the [place] Dept[facility] at [phone] if you have any questions about these instructions. ? ?Surgery Visitation Policy: ? ?Patients undergoing a surgery or procedure may have one family members or support persons with them as long as the person is not COVID-19 positive or experiencing its symptoms.  ? ?

## 2022-03-15 MED ORDER — LACTATED RINGERS IV SOLN: INTRAVENOUS | Status: DC

## 2022-03-15 MED ORDER — CEFAZOLIN SODIUM-DEXTROSE 2-4 GM/100ML-% IV SOLN
2.0000 g | INTRAVENOUS | Status: AC
  Administered 2022-03-16: 2 g via INTRAVENOUS

## 2022-03-15 MED ORDER — ORAL CARE MOUTH RINSE: 15.0000 mL | Freq: Once | OROMUCOSAL | Status: AC

## 2022-03-15 MED ORDER — CHLORHEXIDINE GLUCONATE 0.12 % MT SOLN: 15.0000 mL | Freq: Once | OROMUCOSAL | Status: AC

## 2022-03-16 ENCOUNTER — Ambulatory Visit
Admission: RE | Admit: 2022-03-16 | Discharge: 2022-03-16 | Disposition: A | Discharge status: Home / Self Care | Payer: PRIVATE HEALTH INSURANCE | Source: Ambulatory Visit | Attending: Surgery | Admitting: Surgery

## 2022-03-16 ENCOUNTER — Encounter
Admission: RE | Disposition: A | Discharge status: Home / Self Care | Payer: Self-pay | Source: Ambulatory Visit | Attending: Surgery

## 2022-03-16 ENCOUNTER — Ambulatory Visit: Payer: PRIVATE HEALTH INSURANCE | Admitting: Anesthesiology

## 2022-03-16 ENCOUNTER — Other Ambulatory Visit: Payer: Self-pay

## 2022-03-16 ENCOUNTER — Encounter: Payer: Self-pay | Admitting: Surgery

## 2022-03-16 DIAGNOSIS — M6752 Plica syndrome, left knee: Secondary | ICD-10-CM | POA: Diagnosis not present

## 2022-03-16 DIAGNOSIS — M94262 Chondromalacia, left knee: Secondary | ICD-10-CM | POA: Diagnosis not present

## 2022-03-16 HISTORY — PX: KNEE ARTHROSCOPY: SHX127

## 2022-03-16 LAB — POCT PREGNANCY, URINE: Preg Test, Ur: NEGATIVE

## 2022-03-16 SURGERY — ARTHROSCOPY, KNEE
Anesthesia: General | Site: Knee | Laterality: Left

## 2022-03-16 MED ORDER — LIDOCAINE HCL (PF) 1 % IJ SOLN
INTRAMUSCULAR | Status: AC
  Filled 2022-03-16: qty 30

## 2022-03-16 MED ORDER — KETOROLAC TROMETHAMINE 30 MG/ML IJ SOLN
30.0000 mg | Freq: Once | INTRAMUSCULAR | Status: AC
Start: 2022-03-16 — End: 2022-03-16

## 2022-03-16 MED ORDER — LACTATED RINGERS IR SOLN
Status: DC | PRN
  Administered 2022-03-16: 3000 mL

## 2022-03-16 MED ORDER — 0.9 % SODIUM CHLORIDE (POUR BTL) OPTIME
TOPICAL | Status: DC | PRN
  Administered 2022-03-16: 500 mL

## 2022-03-16 MED ORDER — LIDOCAINE HCL (CARDIAC) PF 100 MG/5ML IV SOSY
PREFILLED_SYRINGE | INTRAVENOUS | Status: DC | PRN
  Administered 2022-03-16: 100 mg via INTRAVENOUS

## 2022-03-16 MED ORDER — FENTANYL CITRATE (PF) 100 MCG/2ML IJ SOLN
INTRAMUSCULAR | Status: AC
  Filled 2022-03-16: qty 2

## 2022-03-16 MED ORDER — FENTANYL CITRATE (PF) 100 MCG/2ML IJ SOLN
25.0000 ug | INTRAMUSCULAR | Status: AC | PRN
  Administered 2022-03-16 (×2): 25 ug via INTRAVENOUS
  Administered 2022-03-16: 50 ug via INTRAVENOUS
  Administered 2022-03-16: 25 ug via INTRAVENOUS

## 2022-03-16 MED ORDER — PHENYLEPHRINE HCL-NACL 20-0.9 MG/250ML-% IV SOLN
INTRAVENOUS | Status: AC
  Filled 2022-03-16: qty 250

## 2022-03-16 MED ORDER — ACETAMINOPHEN 10 MG/ML IV SOLN
INTRAVENOUS | Status: DC | PRN
Start: 2022-03-16 — End: 2022-03-16
  Administered 2022-03-16: 1000 mg via INTRAVENOUS

## 2022-03-16 MED ORDER — MIDAZOLAM HCL 2 MG/2ML IJ SOLN
INTRAMUSCULAR | Status: AC
  Filled 2022-03-16: qty 2

## 2022-03-16 MED ORDER — OXYCODONE HCL 5 MG PO TABS
ORAL_TABLET | ORAL | Status: AC
  Administered 2022-03-16: 5 mg via ORAL
  Filled 2022-03-16: qty 1

## 2022-03-16 MED ORDER — BUPIVACAINE-EPINEPHRINE (PF) 0.5% -1:200000 IJ SOLN
INTRAMUSCULAR | Status: AC
  Filled 2022-03-16: qty 60

## 2022-03-16 MED ORDER — CEFAZOLIN SODIUM-DEXTROSE 2-4 GM/100ML-% IV SOLN
INTRAVENOUS | Status: AC
  Filled 2022-03-16: qty 100

## 2022-03-16 MED ORDER — HYDROCODONE-ACETAMINOPHEN 5-325 MG PO TABS: 1.0000 | ORAL_TABLET | Freq: Four times a day (QID) | ORAL | 0 refills | Status: DC | PRN

## 2022-03-16 MED ORDER — CHLORHEXIDINE GLUCONATE 0.12 % MT SOLN
OROMUCOSAL | Status: AC
  Administered 2022-03-16: 15 mL via OROMUCOSAL
  Filled 2022-03-16: qty 15

## 2022-03-16 MED ORDER — ACETAMINOPHEN 10 MG/ML IV SOLN
INTRAVENOUS | Status: AC
  Filled 2022-03-16: qty 100

## 2022-03-16 MED ORDER — OXYCODONE HCL 5 MG/5ML PO SOLN: 5.0000 mg | Freq: Once | ORAL | Status: AC | PRN

## 2022-03-16 MED ORDER — BUPIVACAINE-EPINEPHRINE (PF) 0.5% -1:200000 IJ SOLN
INTRAMUSCULAR | Status: DC | PRN
  Administered 2022-03-16: 20 mL

## 2022-03-16 MED ORDER — ACETAMINOPHEN 10 MG/ML IV SOLN: 1000.0000 mg | Freq: Once | INTRAVENOUS | Status: DC | PRN

## 2022-03-16 MED ORDER — PROPOFOL 10 MG/ML IV BOLUS
INTRAVENOUS | Status: DC | PRN
  Administered 2022-03-16: 160 mg via INTRAVENOUS

## 2022-03-16 MED ORDER — ONDANSETRON HCL 4 MG/2ML IJ SOLN
INTRAMUSCULAR | Status: AC
  Filled 2022-03-16: qty 2

## 2022-03-16 MED ORDER — PROPOFOL 10 MG/ML IV BOLUS
INTRAVENOUS | Status: AC
  Filled 2022-03-16: qty 20

## 2022-03-16 MED ORDER — DEXAMETHASONE SODIUM PHOSPHATE 10 MG/ML IJ SOLN
INTRAMUSCULAR | Status: DC | PRN
Start: 2022-03-16 — End: 2022-03-16
  Administered 2022-03-16: 10 mg via INTRAVENOUS

## 2022-03-16 MED ORDER — KETOROLAC TROMETHAMINE 30 MG/ML IJ SOLN
INTRAMUSCULAR | Status: DC | PRN
  Administered 2022-03-16: 30 mg via INTRAVENOUS

## 2022-03-16 MED ORDER — OXYCODONE HCL 5 MG PO TABS: 5.0000 mg | ORAL_TABLET | Freq: Once | ORAL | Status: AC | PRN

## 2022-03-16 MED ORDER — FENTANYL CITRATE (PF) 100 MCG/2ML IJ SOLN
INTRAMUSCULAR | Status: DC | PRN
  Administered 2022-03-16: 50 ug via INTRAVENOUS

## 2022-03-16 MED ORDER — LIDOCAINE HCL (PF) 2 % IJ SOLN
INTRAMUSCULAR | Status: AC
  Filled 2022-03-16: qty 5

## 2022-03-16 MED ORDER — DEXAMETHASONE SODIUM PHOSPHATE 10 MG/ML IJ SOLN
INTRAMUSCULAR | Status: AC
  Filled 2022-03-16: qty 1

## 2022-03-16 MED ORDER — FENTANYL CITRATE (PF) 100 MCG/2ML IJ SOLN
INTRAMUSCULAR | Status: AC
  Administered 2022-03-16: 25 ug via INTRAVENOUS
  Filled 2022-03-16: qty 2

## 2022-03-16 MED ORDER — ONDANSETRON HCL 4 MG/2ML IJ SOLN
INTRAMUSCULAR | Status: DC | PRN
  Administered 2022-03-16: 4 mg via INTRAVENOUS

## 2022-03-16 MED ORDER — LIDOCAINE HCL 1 % IJ SOLN
INTRAMUSCULAR | Status: DC | PRN
  Administered 2022-03-16: 60 mL

## 2022-03-16 MED ORDER — DROPERIDOL 2.5 MG/ML IJ SOLN
0.6250 mg | Freq: Once | INTRAMUSCULAR | Status: DC | PRN
  Filled 2022-03-16: qty 2

## 2022-03-16 MED ORDER — PROMETHAZINE HCL 25 MG/ML IJ SOLN: 6.2500 mg | INTRAMUSCULAR | Status: DC | PRN

## 2022-03-16 MED ORDER — FENTANYL CITRATE (PF) 100 MCG/2ML IJ SOLN
INTRAMUSCULAR | Status: AC
  Administered 2022-03-16: 50 ug via INTRAVENOUS
  Filled 2022-03-16: qty 2

## 2022-03-16 MED ORDER — PHENYLEPHRINE 40 MCG/ML (10ML) SYRINGE FOR IV PUSH (FOR BLOOD PRESSURE SUPPORT)
PREFILLED_SYRINGE | INTRAVENOUS | Status: DC | PRN
  Administered 2022-03-16: 80 ug via INTRAVENOUS
  Administered 2022-03-16: 160 ug via INTRAVENOUS
  Administered 2022-03-16 (×2): 80 ug via INTRAVENOUS

## 2022-03-16 MED ORDER — MIDAZOLAM HCL 2 MG/2ML IJ SOLN
INTRAMUSCULAR | Status: DC | PRN
  Administered 2022-03-16 (×2): 1 mg via INTRAVENOUS

## 2022-03-16 SURGICAL SUPPLY — 40 items
APL PRP STRL LF DISP 70% ISPRP (MISCELLANEOUS) ×1
BAG COUNTER SPONGE SURGICOUNT (BAG) IMPLANT
BAG SPNG CNTER NS LX DISP (BAG)
BLADE FULL RADIUS 3.5 (BLADE) ×2 IMPLANT
BLADE SHAVER 4.5X7 STR FR (MISCELLANEOUS) ×1 IMPLANT
BNDG ELASTIC 6X5.8 VLCR STR LF (GAUZE/BANDAGES/DRESSINGS) ×2 IMPLANT
BNDG ESMARK 6X12 TAN STRL LF (GAUZE/BANDAGES/DRESSINGS) ×2 IMPLANT
CHLORAPREP W/TINT 26 (MISCELLANEOUS) ×2 IMPLANT
CUFF TOURN SGL QUICK 24 (TOURNIQUET CUFF)
CUFF TOURN SGL QUICK 34 (TOURNIQUET CUFF)
CUFF TRNQT CYL 24X4X16.5-23 (TOURNIQUET CUFF) IMPLANT
CUFF TRNQT CYL 34X4.125X (TOURNIQUET CUFF) IMPLANT
DRAPE ARTHRO LIMB 89X125 STRL (DRAPES) ×2 IMPLANT
DRAPE IMP U-DRAPE 54X76 (DRAPES) ×2 IMPLANT
ELECT REM PT RETURN 9FT ADLT (ELECTROSURGICAL) ×2
ELECTRODE REM PT RTRN 9FT ADLT (ELECTROSURGICAL) ×1 IMPLANT
GAUZE SPONGE 4X4 12PLY STRL (GAUZE/BANDAGES/DRESSINGS) ×2 IMPLANT
GLOVE SURG ENC MOIS LTX SZ8 (GLOVE) ×4 IMPLANT
GLOVE SURG ENC TEXT LTX SZ7 (GLOVE) ×4 IMPLANT
GLOVE SURG UNDER LTX SZ8 (GLOVE) ×2 IMPLANT
GLOVE SURG UNDER POLY LF SZ7.5 (GLOVE) ×2 IMPLANT
GOWN STRL REUS W/ TWL LRG LVL3 (GOWN DISPOSABLE) ×1 IMPLANT
GOWN STRL REUS W/ TWL XL LVL3 (GOWN DISPOSABLE) ×2 IMPLANT
GOWN STRL REUS W/TWL LRG LVL3 (GOWN DISPOSABLE) ×2
GOWN STRL REUS W/TWL XL LVL3 (GOWN DISPOSABLE) ×4
IV LACTATED RINGER IRRG 3000ML (IV SOLUTION) ×2
IV LR IRRIG 3000ML ARTHROMATIC (IV SOLUTION) ×1 IMPLANT
KIT TURNOVER KIT A (KITS) ×2 IMPLANT
MANIFOLD NEPTUNE II (INSTRUMENTS) ×3 IMPLANT
NDL HYPO 21X1.5 SAFETY (NEEDLE) ×1 IMPLANT
NEEDLE HYPO 21X1.5 SAFETY (NEEDLE) ×2 IMPLANT
PACK ARTHROSCOPY KNEE (MISCELLANEOUS) ×2 IMPLANT
PENCIL ELECTRO HAND CTR (MISCELLANEOUS) ×2 IMPLANT
SPONGE T-LAP 18X18 ~~LOC~~+RFID (SPONGE) ×2 IMPLANT
SUT PROLENE 4 0 PS 2 18 (SUTURE) ×3 IMPLANT
SUT TICRON COATED BLUE 2 0 30 (SUTURE) IMPLANT
SYR 50ML LL SCALE MARK (SYRINGE) ×2 IMPLANT
TUBING INFLOW SET DBFLO PUMP (TUBING) ×2 IMPLANT
WAND WEREWOLF FLOW 90D (MISCELLANEOUS) ×2 IMPLANT
WATER STERILE IRR 500ML POUR (IV SOLUTION) ×2 IMPLANT

## 2022-03-16 NOTE — H&P (Signed)
History of Present Illness: ?Bailey Perry is a 46 y.o.female who is being referred by Cameron Proud, PA-C, for left knee pain. The symptoms began several years ago. Originally, her symptoms developed after a squatting injury in July, 2017. She saw Cameron Proud, PA-C, who tried a steroid injection which provided little if any relief of her symptoms. An MRI scan at that time was unremarkable for any meniscal or ligamentous pathology, so she was encouraged to work on weight loss and a home exercise program. The patient notes that her symptoms have been present ever since, although have been tolerable until she sustained a similar injury when squatting down at work which occurred in September, 2022, which reaggravated her knee symptoms. She saw Cameron Proud, PA-C, in January, 2023 and was sent for an MRI scan, then referred to me for further evaluation and treatment.  ? ?She reports 4/10 pain on today's visit. The pain is located along the anteromedial aspect of the knee. The pain is described as aching, dull, stabbing and throbbing. The symptoms are aggravated by walking, standing pivot and squatting or excessively flexing her knee. She also describes no mechanical symptoms. She has associated swelling and no deformity. She has tried acetaminophen, over-the-counter medications, anti-inflammatories, narcotic medications, steroid injections, a home exercise program and heat with limited benefit. ? ?Current Outpatient Medications: ? norethindrone-ethinyl estradiol (JUNEL FE 1/20) 1 mg-20 mcg (21)/75 mg (7) tablet Take 1 tablet by mouth once daily  ? ?Allergies:  ? Iodine Itching  ? Povidone-Iodine Rash and Swelling  ? Omeprazole Rash  ? Shellfish Containing Products Rash  ? ?Past Medical History:  ? Chickenpox  ? Shellfish allergy  ? ?Past Surgical History:  ? APPENDECTOMY  ? CESAREAN SECTION x2  ? ?Family History:  ? Diabetes Mother  ? Heart disease Mother  ? Heart block Mother  ? High blood pressure (Hypertension)  Mother  ? Lymphoma Father  ? ?Social History:  ? ?Socioeconomic History:  ? Marital status: Married  ?Tobacco Use  ? Smoking status: Never  ? Smokeless tobacco: Never  ?Vaping Use  ? Vaping Use: Never used  ?Substance and Sexual Activity  ? Alcohol use: No  ? Drug use: No  ? Sexual activity: Yes  ? ?Review of Systems:  ?A comprehensive 14 point ROS was performed, reviewed, and the pertinent orthopaedic findings are documented in the HPI. ? ?Physical Exam: ?Vitals:  ?02/21/22 1135  ?BP: 126/86  ?Weight: 98.9 kg (218 lb)  ?Height: 157.5 cm ('5\' 2"'$ )  ?PainSc: 4  ?PainLoc: Knee  ? ?General/Constitutional: Pleasant overweight middle-aged female in no acute distress. ?Neuro/Psych: Normal mood and affect, oriented to person, place and time. ?Eyes: Non-icteric. Pupils are equal, round, and reactive to light, and exhibit synchronous movement. ?Lymphatic: No palpable adenopathy. ?Respiratory: Lungs clear to auscultation, Normal chest excursion, No wheezes and Non-labored breathing ?Cardiovascular: Regular rate and rhythm. No murmurs. and No edema, swelling or tenderness, except as noted in detailed exam. ?Vascular: No edema, swelling or tenderness, except as noted in detailed exam. ?Integumentary: No impressive skin lesions present, except as noted in detailed exam. ?Musculoskeletal: Unremarkable, except as noted in detailed exam. ? ?Left knee exam: ?GAIT: moderate limp and uses no assistive devices. ?ALIGNMENT: normal ?SKIN: unremarkable ?SWELLING: mild ?EFFUSION: trace ?WARMTH: no warmth ?TENDERNESS: moderate tenderness over the anteromedial more so than the medial joint line, mild tenderness along anterolateral joint line ?ROM: 0 to 120 degrees with moderate pain anteriorly in maximal flexion ?McMURRAY'S: negative ?PATELLOFEMORAL: normal tracking with no peri-patellar tenderness  and negative apprehension sign ?CREPITUS: no ?LACHMAN'S: negative ?PIVOT SHIFT: negative ?ANTERIOR DRAWER: negative ?POSTERIOR DRAWER:  negative ?VARUS/VALGUS: stable ? ?She is neurovascular intact to the left lower extremity and foot. ? ?Left Knee Imaging:  ?A recent MRI scan of the left knee is available for review and has been reviewed by myself. By report, the study demonstrates evidence of minimal tendinopathy of the patellar tendon at the inferior pole of the patella, but no evidence for meniscal or ligamentous pathology. The official report states that there is no obvious abnormality involving Hoffa's fat pad, nor is there evidence of a plica. However, by my review, there appears to be inflammation of the articular margin of the infrapatellar fat pad with extension into the patellofemoral space. Both the films report were reviewed by myself and discussed with the patient. ? ?Assessment:  ? Synovitis of left knee.  ? Strain of left knee.  ? ?Plan: ?The treatment options were discussed with the patient. In addition, patient educational materials were provided regarding the diagnosis and treatment options. The patient is quite frustrated by her persistent symptoms and functional limitations, and is ready to consider more aggressive treatment options. Therefore, I have recommended a surgical procedure, specifically a right knee arthroscopy with debridement and excision of a presumed symptomatic plica. The procedure was discussed with the patient, as were the potential risks (including bleeding, infection, nerve and/or blood vessel injury, persistent or recurrent pain, failure of the repair, progression of arthritis, need for further surgery, blood clots, strokes, heart attacks and/or arhythmias, pneumonia, etc.) and benefits. The patient states her understanding and wishes to proceed. All of the patient's questions and concerns were answered. She can call any time with further concerns. She will return to work without restrictions. She will follow up post-surgery, routine. ? ? ?H&P reviewed and patient re-examined. No changes. ? ?

## 2022-03-16 NOTE — Discharge Instructions (Addendum)
Orthopedic discharge instructions: ?Keep dressing dry and intact.  ?May shower after dressing changed on post-op day #4 (Sunday).  ?Cover sutures with Band-Aids after drying off. ?Apply ice frequently to knee. ?Take Aleve 1-2 tabs BID with meals for 3-5 days, then as necessary. ?Take pain medication as prescribed or ES Tylenol when needed.  ?May weight-bear as tolerated - use crutches or walker as needed.AMBULATORY SURGERY  ?DISCHARGE INSTRUCTIONS ? ? ?The drugs that you were given will stay in your system until tomorrow so for the next 24 hours you should not: ? ?Drive an automobile ?Make any legal decisions ?Drink any alcoholic beverage ? ? ?You may resume regular meals tomorrow.  Today it is better to start with liquids and gradually work up to solid foods. ? ?You may eat anything you prefer, but it is better to start with liquids, then soup and crackers, and gradually work up to solid foods. ? ? ?Please notify your doctor immediately if you have any unusual bleeding, trouble breathing, redness and pain at the surgery site, drainage, fever, or pain not relieved by medication. ? ? ? ?Additional Instructions: ? ? ?Please contact your physician with any problems or Same Day Surgery at [phone], Monday through Friday 6 am to 4 pm, or [place] at [facility] number at [phone].  ? ? ? ? ? ? ? ?

## 2022-03-16 NOTE — Op Note (Signed)
03/16/2022 ? ?8:38 AM ? ?Patient:   Bailey Perry ? ?Pre-Op Diagnosis:   Synovitis with probable symptomatic plica, left knee. ? ?Postoperative diagnosis:   Same ? ?Procedure:   Arthroscopic debridement of symptomatic plica, left knee. ? ?Surgeon:   Pascal Lux, MD ? ?Assistant:   Myra Rude, PA-S ? ?Anesthesia:   General LMA ? ?Findings:   As above. There were grade 1 chondromalacial changes involving the medial edge of the medial femoral condyle, consistent with a symptomatic plica. In addition, there were grade 1 chondromalacial changes involving the central portion of the femoral trochlea, as well as the central weightbearing portion of the lateral tibial plateau. The remaining articular surfaces all were in satisfactory condition. The medial and lateral menisci both were in excellent condition, as were the anterior and posterior cruciate ligaments. ? ?Complications:   None ? ?EBL:   5 cc. ? ?Total fluids:   600 cc of crystalloid. ? ?Tourniquet time:   None ? ?Drains:   None ? ?Closure:   4-0 Prolene interrupted sutures. ? ?Brief clinical note:   The patient is a 47 year old female with a several year history of anteromedial left knee pain. Her symptoms have persisted despite medications, activity modification, therapy, steroid injection, etc. Her history and examination were suspicious for a symptomatic plica. Preoperative MRI scanning was unremarkable for meniscal or ligamentous pathology. The patient presents at this time for arthroscopy and debridement of a presumed symptomatic medial plica. ? ?Procedure:   The patient was brought into the operating room and lain in the supine position. After adequate general laryngeal mask anesthesia was obtained, a timeout was performed to verify the appropriate side. The patient's left knee was injected sterilely using a solution of 30 cc of 1% lidocaine and 30 cc of 0.5% Sensorcaine with epinephrine. The left lower extremity was prepped with ChloraPrep  solution before being draped sterilely. Preoperative antibiotics were administered. The expected portal sites were injected with 0.5% Sensorcaine with epinephrine before the camera was placed in the anterolateral portal and instrumentation performed through the anteromedial portal.  ? ?The knee was sequentially examined beginning in the suprapatellar pouch, then progressing to the patellofemoral space, the medial gutter and compartment, the notch, and finally the lateral compartment and gutter. The findings were as described above. Abundant reactive synovial tissues anteriorly were debrided using the full-radius resector in order to improve visualization. This debridement exposed a thickened plica over the anteromedial aspect of the knee which appeared to be abrading the medial edge of the medial femoral condyle. This plica was debrided back to stable margins, along with the thickened/inflamed adjacent synovial tissue. The medial and lateral menisci were probed and found to be stable. The ArthroCare wand was inserted and used to obtain hemostasis. The instruments were removed from the joint after suctioning the excess fluid.  ? ?The portal sites were closed using 4-0 Prolene interrupted sutures before a sterile bulky dressing was applied to the knee. The patient was then awakened, extubated, and returned to the recovery room in satisfactory condition after tolerating the procedure well. ?

## 2022-03-16 NOTE — Anesthesia Preprocedure Evaluation (Addendum)
Anesthesia Evaluation  ?Patient identified by MRN, date of birth, ID band ?Patient awake ? ? ? ?Reviewed: ?Allergy & Precautions, NPO status , Patient's Chart, lab work & pertinent test results ? ?Airway ?Mallampati: III ? ?TM Distance: >3 FB ?Neck ROM: full ? ? ? Dental ?no notable dental hx. ? ?  ?Pulmonary ?neg pulmonary ROS,  ?  ?Pulmonary exam normal ? ? ? ? ? ? ? Cardiovascular ?negative cardio ROS ?Normal cardiovascular exam ? ? ?  ?Neuro/Psych ?negative neurological ROS ? negative psych ROS  ? GI/Hepatic ?Neg liver ROS, hiatal hernia, GERD  Medicated and Controlled,  ?Endo/Other  ?negative endocrine ROS ? Renal/GU ?  ? ?  ?Musculoskeletal ? ? Abdominal ?(+) + obese,   ?Peds ? Hematology ?negative hematology ROS ?(+)   ?Anesthesia Other Findings ?Past Medical History: ?No date: Abnormal Pap smear of cervix ?No date: Anemia ?    Comment:  with pregnancy only 27 years ago ?No date: GERD (gastroesophageal reflux disease) ?No date: Hiatal hernia ? ?Past Surgical History: ?No date: APPENDECTOMY ?No date: CESAREAN SECTION ?    Comment:  X 2 ? ?BMI   ? Body Mass Index: 39.32 kg/m?  ?  ? ? Reproductive/Obstetrics ?negative OB ROS ? ?  ? ? ? ? ? ? ? ? ? ? ? ? ? ?  ?  ? ? ? ? ? ? ? ? ?Anesthesia Physical ?Anesthesia Plan ? ?ASA: 2 ? ?Anesthesia Plan: General  ? ?Post-op Pain Management: Toradol IV (intra-op)*, Ofirmev IV (intra-op)* and Regional block*  ? ?Induction: Intravenous ? ?PONV Risk Score and Plan: 3 and Ondansetron, Dexamethasone, Midazolam and Treatment may vary due to age or medical condition ? ?Airway Management Planned: LMA ? ?Additional Equipment:  ? ?Intra-op Plan:  ? ?Post-operative Plan: Extubation in OR ? ?Informed Consent: I have reviewed the patients History and Physical, chart, labs and discussed the procedure including the risks, benefits and alternatives for the proposed anesthesia with the patient or authorized representative who has indicated his/her  understanding and acceptance.  ? ? ? ?Dental Advisory Given ? ?Plan Discussed with: Anesthesiologist, CRNA and Surgeon ? ?Anesthesia Plan Comments:   ? ? ? ? ? ? ?Anesthesia Quick Evaluation ? ?

## 2022-03-16 NOTE — Transfer of Care (Signed)
Immediate Anesthesia Transfer of Care Note ? ?Patient: Bailey Perry ? ?Procedure(s) Performed: LEFT KNEE ARTHROSCOPY WITH DEBRIDEMENT AND EXCISION OF SYMPTOMATIC MEDIAL PLICA (Left: Knee) ? ?Patient Location: PACU ? ?Anesthesia Type:General ? ?Level of Consciousness: awake ? ?Airway & Oxygen Therapy: Patient Spontanous Breathing and Patient connected to face mask oxygen ? ?Post-op Assessment: Report given to RN and Post -op Vital signs reviewed and stable ? ?Post vital signs: Reviewed and stable ? ?Last Vitals:  ?Vitals Value Taken Time  ?BP 118/54 03/16/22 0834  ?Temp    ?Pulse 66 03/16/22 0834  ?Resp 16 03/16/22 0834  ?SpO2 100 % 03/16/22 0834  ? ? ?Last Pain:  ?Vitals:  ? 03/16/22 0618  ?TempSrc: Temporal  ?PainSc: 3   ?   ? ?  ? ?Complications: No notable events documented. ?

## 2022-03-16 NOTE — Anesthesia Procedure Notes (Signed)
Procedure Name: LMA Insertion ?Date/Time: 03/16/2022 7:34 AM ?Performed by: Loletha Grayer, CRNA ?Pre-anesthesia Checklist: Patient identified, Patient being monitored, Timeout performed, Emergency Drugs available and Suction available ?Patient Re-evaluated:Patient Re-evaluated prior to induction ?Oxygen Delivery Method: Circle system utilized ?Preoxygenation: Pre-oxygenation with 100% oxygen ?Induction Type: IV induction ?Ventilation: Mask ventilation without difficulty ?LMA: LMA inserted ?LMA Size: 4.0 ?Number of attempts: 1 ?Placement Confirmation: positive ETCO2 ?Tube secured with: Tape ?Dental Injury: Teeth and Oropharynx as per pre-operative assessment  ? ? ? ? ?

## 2022-03-17 NOTE — Anesthesia Postprocedure Evaluation (Signed)
Anesthesia Post Note ? ?Patient: Bailey Perry ? ?Procedure(s) Performed: LEFT KNEE ARTHROSCOPY WITH DEBRIDEMENT AND EXCISION OF SYMPTOMATIC MEDIAL PLICA (Left: Knee) ? ?Patient location during evaluation: PACU ?Anesthesia Type: General ?Level of consciousness: awake and alert ?Pain management: pain level controlled ?Vital Signs Assessment: post-procedure vital signs reviewed and stable ?Respiratory status: spontaneous breathing, nonlabored ventilation and respiratory function stable ?Cardiovascular status: blood pressure returned to baseline and stable ?Postop Assessment: no apparent nausea or vomiting ?Anesthetic complications: no ? ? ?No notable events documented. ? ? ?Last Vitals:  ?Vitals:  ? 03/16/22 0935 03/16/22 0947  ?BP:  121/75  ?Pulse: 61 70  ?Resp: 18 18  ?Temp:  (!) 36.3 ?C  ?SpO2: 94% 100%  ?  ?Last Pain:  ?Vitals:  ? 03/16/22 0947  ?TempSrc: Temporal  ?PainSc: 3   ? ? ?  ?  ?  ?  ?  ?  ? ?[place] Ouch ? ? ? ? ?

## 2022-03-18 DIAGNOSIS — M6752 Plica syndrome, left knee: Secondary | ICD-10-CM | POA: Insufficient documentation

## 2022-06-21 ENCOUNTER — Other Ambulatory Visit: Payer: Self-pay | Admitting: Certified Nurse Midwife

## 2022-11-15 ENCOUNTER — Encounter: Payer: No Typology Code available for payment source | Admitting: Family Medicine

## 2022-11-29 ENCOUNTER — Other Ambulatory Visit (HOSPITAL_COMMUNITY)
Admission: RE | Admit: 2022-11-29 | Discharge: 2022-11-29 | Disposition: A | Discharge status: Home / Self Care | Payer: PRIVATE HEALTH INSURANCE | Source: Ambulatory Visit | Attending: Family Medicine | Admitting: Family Medicine

## 2022-11-29 ENCOUNTER — Ambulatory Visit (INDEPENDENT_AMBULATORY_CARE_PROVIDER_SITE_OTHER): Payer: PRIVATE HEALTH INSURANCE | Admitting: Family Medicine

## 2022-11-29 ENCOUNTER — Encounter: Payer: Self-pay | Admitting: Family Medicine

## 2022-11-29 VITALS — BP 126/83 | HR 73 | Temp 99.2°F | Ht 64.6 in | Wt 225.1 lb

## 2022-11-29 DIAGNOSIS — Z0000 Encounter for general adult medical examination without abnormal findings: Secondary | ICD-10-CM | POA: Diagnosis present

## 2022-11-29 DIAGNOSIS — E559 Vitamin D deficiency, unspecified: Secondary | ICD-10-CM

## 2022-11-29 DIAGNOSIS — Z1231 Encounter for screening mammogram for malignant neoplasm of breast: Secondary | ICD-10-CM

## 2022-11-29 DIAGNOSIS — R232 Flushing: Secondary | ICD-10-CM

## 2022-11-29 DIAGNOSIS — Z1211 Encounter for screening for malignant neoplasm of colon: Secondary | ICD-10-CM

## 2022-11-29 DIAGNOSIS — Z833 Family history of diabetes mellitus: Secondary | ICD-10-CM | POA: Diagnosis not present

## 2022-11-29 MED ORDER — GABAPENTIN 100 MG PO CAPS: 100.0000 mg | ORAL_CAPSULE | Freq: Every day | ORAL | 3 refills | Status: DC

## 2022-11-29 NOTE — Assessment & Plan Note (Signed)
Labs drawn today. Await results.

## 2022-11-29 NOTE — Progress Notes (Signed)
BP 126/83   Pulse 73   Temp 99.2 F (37.3 C) (Oral)   Ht 5' 4.6" (1.641 m)   Wt 225 lb 1.6 oz (102.1 kg)   SpO2 99%   BMI 37.92 kg/m    Subjective:    Patient ID: Bailey Perry, female    DOB: 08-29-1976, 46 y.o.   MRN: [id number]  HPI: Bailey Perry is a 46 y.o. female presenting on 11/29/2022 for comprehensive medical examination. Current medical complaints include:none  Menopausal Symptoms: yes- have been very bothersome  Depression Screen done today and results listed below:     11/29/2022   10:31 AM 12/09/2020    3:32 PM 11/20/2019    8:19 AM 10/17/2017    2:28 PM 09/19/2017    1:52 PM  Depression screen PHQ 2/9  Decreased Interest 0 0 0 0 2  Down, Depressed, Hopeless 0 0 0 0 3  PHQ - 2 Score 0 0 0 0 5  Altered sleeping 1   0 3  Tired, decreased energy 1   0 3  Change in appetite 0   0 0  Feeling bad or failure about yourself  0   0 1  Trouble concentrating 0   0 0  Moving slowly or fidgety/restless 0   0 0  Suicidal thoughts 0   0 0  PHQ-9 Score 2   0 12  Difficult doing work/chores Not difficult at all         Past Medical History:  Past Medical History:  Diagnosis Date   Abnormal Pap smear of cervix    Anemia    with pregnancy only 27 years ago   GERD (gastroesophageal reflux disease)    Hiatal hernia     Surgical History:  Past Surgical History:  Procedure Laterality Date   APPENDECTOMY     CESAREAN SECTION     X 2   KNEE ARTHROSCOPY Left 03/16/2022   Procedure: LEFT KNEE ARTHROSCOPY WITH DEBRIDEMENT AND EXCISION OF SYMPTOMATIC MEDIAL PLICA;  Surgeon: Corky Mull, MD;  Location: [facility] ORS;  Service: Orthopedics;  Laterality: Left;    Medications:  Current Outpatient Medications on File Prior to Visit  Medication Sig   JUNEL FE 1/20 1-20 MG-MCG tablet TAKE 1 TABLET BY MOUTH EVERY DAY   acetaminophen (TYLENOL) 500 MG tablet Take 1,000 mg by mouth every 6 (six) hours as needed for moderate pain. (Patient not taking: Reported on  11/29/2022)   HYDROcodone-acetaminophen (NORCO) 5-325 MG tablet Take 1-2 tablets by mouth every 6 (six) hours as needed for moderate pain or severe pain. MAXIMUM TOTAL ACETAMINOPHEN DOSE IS 4000 MG PER DAY (Patient not taking: Reported on 11/29/2022)   omeprazole (PRILOSEC OTC) 20 MG tablet Take 20 mg by mouth as needed. (Patient not taking: Reported on 11/29/2022)   No current facility-administered medications on file prior to visit.    Allergies:  Allergies  Allergen Reactions   Iodine Itching   Betadine [Povidone Iodine] Swelling and Rash   Omeprazole Rash    Pt takes Prilosec with no problems   Shellfish Allergy Rash    Social History:  Social History   Socioeconomic History   Marital status: Married    Spouse name: Not on file   Number of children: Not on file   Years of education: Not on file   Highest education level: Not on file  Occupational History   Not on file  Tobacco Use   Smoking status: Never   Smokeless tobacco:  Never  Vaping Use   Vaping Use: Never used  Substance and Sexual Activity   Alcohol use: No   Drug use: No   Sexual activity: Yes    Birth control/protection: I.U.D.  Other Topics Concern   Not on file  Social History Narrative   Not on file   Social Determinants of Health   Financial Resource Strain: Not on file  Food Insecurity: Not on file  Transportation Needs: Not on file  Physical Activity: Not on file  Stress: Not on file  Social Connections: Not on file  Intimate Partner Violence: Not on file   Social History   Tobacco Use  Smoking Status Never  Smokeless Tobacco Never   Social History   Substance and Sexual Activity  Alcohol Use No    Family History:  Family History  Problem Relation Age of Onset   Diabetes Mother    Heart disease Mother    Hypertension Mother    Cancer Father        Lymphoma   Arthritis Paternal Grandfather     Past medical history, surgical history, medications, allergies, family history  and social history reviewed with patient today and changes made to appropriate areas of the chart.   Review of Systems  Constitutional:  Positive for diaphoresis. Negative for chills, fever, malaise/fatigue and weight loss.  HENT: Negative.    Eyes:  Positive for blurred vision. Negative for double vision, photophobia, pain, discharge and redness.  Respiratory: Negative.    Cardiovascular:  Positive for leg swelling. Negative for chest pain, palpitations, orthopnea, claudication and PND.  Gastrointestinal:  Positive for constipation and heartburn. Negative for abdominal pain, blood in stool, diarrhea, melena, nausea and vomiting.  Genitourinary: Negative.   Musculoskeletal: Negative.   Skin: Negative.        Irritation in folds and white stuff between her toes  Neurological:  Positive for headaches. Negative for dizziness, tingling, tremors, sensory change, speech change, focal weakness, seizures, loss of consciousness and weakness.  Endo/Heme/Allergies:  Positive for polydipsia. Negative for environmental allergies. Does not bruise/bleed easily.  Psychiatric/Behavioral: Negative.     All other ROS negative except what is listed above and in the HPI.      Objective:    BP 126/83   Pulse 73   Temp 99.2 F (37.3 C) (Oral)   Ht 5' 4.6" (1.641 m)   Wt 225 lb 1.6 oz (102.1 kg)   SpO2 99%   BMI 37.92 kg/m   Wt Readings from Last 3 Encounters:  11/29/22 225 lb 1.6 oz (102.1 kg)  03/16/22 215 lb (97.5 kg)  12/29/21 221 lb 5.5 oz (100.4 kg)    Physical Exam Vitals and nursing note reviewed.  Constitutional:      General: She is not in acute distress.    Appearance: Normal appearance. She is not ill-appearing, toxic-appearing or diaphoretic.  HENT:     Head: Normocephalic and atraumatic.     Right Ear: Tympanic membrane, ear canal and external ear normal. There is no impacted cerumen.     Left Ear: Tympanic membrane, ear canal and external ear normal. There is no impacted cerumen.      Nose: Nose normal. No congestion or rhinorrhea.     Mouth/Throat:     Mouth: Mucous membranes are moist.     Pharynx: Oropharynx is clear. No oropharyngeal exudate or posterior oropharyngeal erythema.  Eyes:     General: No scleral icterus.       Right eye: No discharge.  Left eye: No discharge.     Extraocular Movements: Extraocular movements intact.     Conjunctiva/sclera: Conjunctivae normal.     Pupils: Pupils are equal, round, and reactive to light.  Neck:     Vascular: No carotid bruit.  Cardiovascular:     Rate and Rhythm: Normal rate and regular rhythm.     Pulses: Normal pulses.     Heart sounds: No murmur heard.    No friction rub. No gallop.  Pulmonary:     Effort: Pulmonary effort is normal. No respiratory distress.     Breath sounds: Normal breath sounds. No stridor. No wheezing, rhonchi or rales.  Chest:     Chest wall: No tenderness.  Abdominal:     General: Abdomen is flat. Bowel sounds are normal. There is no distension.     Palpations: Abdomen is soft. There is no mass.     Tenderness: There is no abdominal tenderness. There is no right CVA tenderness, left CVA tenderness, guarding or rebound.     Hernia: No hernia is present.  Genitourinary:    Comments: Breast and pelvic exams deferred with shared decision making Musculoskeletal:        General: No swelling, tenderness, deformity or signs of injury.     Cervical back: Normal range of motion and neck supple. No rigidity. No muscular tenderness.     Right lower leg: No edema.     Left lower leg: No edema.  Lymphadenopathy:     Cervical: No cervical adenopathy.  Skin:    General: Skin is warm and dry.     Capillary Refill: Capillary refill takes less than 2 seconds.     Coloration: Skin is not jaundiced or pale.     Findings: No bruising, erythema, lesion or rash.  Neurological:     General: No focal deficit present.     Mental Status: She is alert and oriented to person, place, and time. Mental  status is at baseline.     Cranial Nerves: No cranial nerve deficit.     Sensory: No sensory deficit.     Motor: No weakness.     Coordination: Coordination normal.     Gait: Gait normal.     Deep Tendon Reflexes: Reflexes normal.  Psychiatric:        Mood and Affect: Mood normal.        Behavior: Behavior normal.        Thought Content: Thought content normal.        Judgment: Judgment normal.     Results for orders placed or performed during the hospital encounter of 03/16/22  Pregnancy, urine POC  Result Value Ref Range   Preg Test, Ur NEGATIVE NEGATIVE      Assessment & Plan:   Problem List Items Addressed This Visit       Cardiovascular and Mediastinum   Hot flashes    Will start gabapentin at bedtime. Call with any concerns. Recheck in about 3 months.         Other   Vitamin D deficiency    Labs drawn today. Await results.       Relevant Orders   VITAMIN D 25 Hydroxy (Vit-D Deficiency, Fractures)   Other Visit Diagnoses     Routine general medical examination at a health care facility    -  Primary   Vaccines declined. Screening labs checked today. Pap done. Mammo and colonoscopy ordered. Continue diet and exercise. Call with any concerns.   Relevant Orders  CBC with Differential/Platelet   Comprehensive metabolic panel   Lipid Panel w/o Chol/HDL Ratio   Urinalysis, Routine w reflex microscopic   TSH   HIV Antibody (routine testing w rflx)   Cytology - PAP   Family history of diabetes mellitus (DM)       Will check A1c. Await results.   Relevant Orders   Hgb A1c w/o eAG   Encounter for screening mammogram for malignant neoplasm of breast       Mammo ordered today.   Relevant Orders   MM 3D SCREEN BREAST BILATERAL   Screening for colon cancer       Referral to GI placed today.   Relevant Orders   Ambulatory referral to Gastroenterology        Follow up plan: Return in about 3 months (around 02/28/2023) for follow up hot  flashes.   LABORATORY TESTING:  - Pap smear: pap done  IMMUNIZATIONS:   - Tdap: Tetanus vaccination status reviewed: will hold off for now. - Influenza: Refused - Pneumovax: Not applicable - Prevnar: Not applicable - COVID: Up to date - HPV: Not applicable - Shingrix vaccine: Not applicable  SCREENING: -Mammogram: Ordered today  - Colonoscopy: Ordered today    PATIENT COUNSELING:   Advised to take 1 mg of folate supplement per day if capable of pregnancy.   Sexuality: Discussed sexually transmitted diseases, partner selection, use of condoms, avoidance of unintended pregnancy  and contraceptive alternatives.   Advised to avoid cigarette smoking.  I discussed with the patient that most people either abstain from alcohol or drink within safe limits (<=14/week and <=4 drinks/occasion for males, <=7/weeks and <= 3 drinks/occasion for females) and that the risk for alcohol disorders and other health effects rises proportionally with the number of drinks per week and how often a drinker exceeds daily limits.  Discussed cessation/primary prevention of drug use and availability of treatment for abuse.   Diet: Encouraged to adjust caloric intake to maintain  or achieve ideal body weight, to reduce intake of dietary saturated fat and total fat, to limit sodium intake by avoiding high sodium foods and not adding table salt, and to maintain adequate dietary potassium and calcium preferably from fresh fruits, vegetables, and low-fat dairy products.    stressed the importance of regular exercise  Injury prevention: Discussed safety belts, safety helmets, smoke detector, smoking near bedding or upholstery.   Dental health: Discussed importance of regular tooth brushing, flossing, and dental visits.    NEXT PREVENTATIVE PHYSICAL DUE IN 1 YEAR. Return in about 3 months (around 02/28/2023) for follow up hot flashes.

## 2022-11-29 NOTE — Patient Instructions (Signed)
Please call to schedule your mammogram and/or bone density: [facility]  Address: [address], [place], [place] [address] Phone: [phone]  [facility] [place] [address] [place],  [place]  [address] Phone: [phone]   

## 2022-11-29 NOTE — Assessment & Plan Note (Signed)
Will start gabapentin at bedtime. Call with any concerns. Recheck in about 3 months.

## 2022-11-30 ENCOUNTER — Telehealth: Payer: Self-pay | Admitting: *Deleted

## 2022-11-30 ENCOUNTER — Other Ambulatory Visit: Payer: Self-pay | Admitting: *Deleted

## 2022-11-30 DIAGNOSIS — Z1211 Encounter for screening for malignant neoplasm of colon: Secondary | ICD-10-CM

## 2022-11-30 LAB — URINALYSIS, ROUTINE W REFLEX MICROSCOPIC
Bilirubin, UA: NEGATIVE
Glucose, UA: NEGATIVE
Ketones, UA: NEGATIVE
Leukocytes,UA: NEGATIVE
Nitrite, UA: NEGATIVE
Protein,UA: NEGATIVE
RBC, UA: NEGATIVE
Specific Gravity, UA: 1.02 (ref 1.005–1.030)
Urobilinogen, Ur: 1 mg/dL (ref 0.2–1.0)
pH, UA: 6 (ref 5.0–7.5)

## 2022-11-30 LAB — COMPREHENSIVE METABOLIC PANEL
ALT: 29 IU/L (ref 0–32)
AST: 22 IU/L (ref 0–40)
Albumin/Globulin Ratio: 1.2 (ref 1.2–2.2)
Albumin: 3.9 g/dL (ref 3.9–4.9)
Alkaline Phosphatase: 71 IU/L (ref 44–121)
BUN/Creatinine Ratio: 8 — ABNORMAL LOW (ref 9–23)
BUN: 8 mg/dL (ref 6–24)
Bilirubin Total: 0.4 mg/dL (ref 0.0–1.2)
CO2: 21 mmol/L (ref 20–29)
Calcium: 9.1 mg/dL (ref 8.7–10.2)
Chloride: 102 mmol/L (ref 96–106)
Creatinine, Ser: 0.95 mg/dL (ref 0.57–1.00)
Globulin, Total: 3.2 g/dL (ref 1.5–4.5)
Glucose: 76 mg/dL (ref 70–99)
Potassium: 4.2 mmol/L (ref 3.5–5.2)
Sodium: 138 mmol/L (ref 134–144)
Total Protein: 7.1 g/dL (ref 6.0–8.5)
eGFR: 74 mL/min/{1.73_m2} (ref >59)

## 2022-11-30 LAB — CBC WITH DIFFERENTIAL/PLATELET
Basophils Absolute: 0.1 10*3/uL (ref 0.0–0.2)
Basos: 1 %
EOS (ABSOLUTE): 0.1 10*3/uL (ref 0.0–0.4)
Eos: 1 %
Hematocrit: 40 % (ref 34.0–46.6)
Hemoglobin: 13.3 g/dL (ref 11.1–15.9)
Immature Grans (Abs): 0 10*3/uL (ref 0.0–0.1)
Immature Granulocytes: 0 %
Lymphocytes Absolute: 2 10*3/uL (ref 0.7–3.1)
Lymphs: 35 %
MCH: 32 pg (ref 26.6–33.0)
MCHC: 33.3 g/dL (ref 31.5–35.7)
MCV: 96 fL (ref 79–97)
Monocytes Absolute: 0.3 10*3/uL (ref 0.1–0.9)
Monocytes: 6 %
Neutrophils Absolute: 3.3 10*3/uL (ref 1.4–7.0)
Neutrophils: 57 %
Platelets: 375 10*3/uL (ref 150–450)
RBC: 4.16 x10E6/uL (ref 3.77–5.28)
RDW: 11.9 % (ref 11.7–15.4)
WBC: 5.8 10*3/uL (ref 3.4–10.8)

## 2022-11-30 LAB — HGB A1C W/O EAG: Hgb A1c MFr Bld: 5.5 % (ref 4.8–5.6)

## 2022-11-30 LAB — HIV ANTIBODY (ROUTINE TESTING W REFLEX): HIV Screen 4th Generation wRfx: NONREACTIVE

## 2022-11-30 LAB — LIPID PANEL W/O CHOL/HDL RATIO
Cholesterol, Total: 182 mg/dL (ref 100–199)
HDL: 69 mg/dL (ref >39)
LDL Chol Calc (NIH): 100 mg/dL — ABNORMAL HIGH (ref 0–99)
Triglycerides: 68 mg/dL (ref 0–149)
VLDL Cholesterol Cal: 13 mg/dL (ref 5–40)

## 2022-11-30 LAB — TSH: TSH: 1.11 u[IU]/mL (ref 0.450–4.500)

## 2022-11-30 LAB — VITAMIN D 25 HYDROXY (VIT D DEFICIENCY, FRACTURES): Vit D, 25-Hydroxy: 25.4 ng/mL — ABNORMAL LOW (ref 30.0–100.0)

## 2022-11-30 MED ORDER — NA SULFATE-K SULFATE-MG SULF 17.5-3.13-1.6 GM/177ML PO SOLN: 1.0000 | Freq: Once | ORAL | 0 refills | Status: AC

## 2022-11-30 NOTE — Telephone Encounter (Signed)
Gastroenterology Pre-Procedure Review  Request Date: 12/14/2022 Requesting Physician: Dr. Vicente Males  PATIENT REVIEW QUESTIONS: The patient responded to the following health history questions as indicated:    1. Are you having any GI issues? yes (constipation, gerd) 2. Do you have a personal history of Polyps? no 3. Do you have a family history of Colon Cancer or Polyps? no 4. Diabetes Mellitus? no 5. Joint replacements in the past 12 months?yes, knee surgery in 05/2022 6. Major health problems in the past 3 months?no 7. Any artificial heart valves, MVP, or defibrillator?no    MEDICATIONS & ALLERGIES:    Patient reports the following regarding taking any anticoagulation/antiplatelet therapy:   Plavix, Coumadin, Eliquis, Xarelto, Lovenox, Pradaxa, Brilinta, or Effient? no Aspirin? no  Patient confirms/reports the following medications:  Current Outpatient Medications  Medication Sig Dispense Refill   acetaminophen (TYLENOL) 500 MG tablet Take 1,000 mg by mouth every 6 (six) hours as needed for moderate pain. (Patient not taking: Reported on 11/29/2022)     gabapentin (NEURONTIN) 100 MG capsule Take 1 capsule (100 mg total) by mouth at bedtime. 30 capsule 3   HYDROcodone-acetaminophen (NORCO) 5-325 MG tablet Take 1-2 tablets by mouth every 6 (six) hours as needed for moderate pain or severe pain. MAXIMUM TOTAL ACETAMINOPHEN DOSE IS 4000 MG PER DAY (Patient not taking: Reported on 11/29/2022) 30 tablet 0   JUNEL FE 1/20 1-20 MG-MCG tablet TAKE 1 TABLET BY MOUTH EVERY DAY 84 tablet 3   omeprazole (PRILOSEC OTC) 20 MG tablet Take 20 mg by mouth as needed. (Patient not taking: Reported on 11/29/2022)     No current facility-administered medications for this visit.    Patient confirms/reports the following allergies:  Allergies  Allergen Reactions   Iodine Itching   Betadine [Povidone Iodine] Swelling and Rash   Omeprazole Rash    Pt takes Prilosec with no problems   Shellfish Allergy Rash     No orders of the defined types were placed in this encounter.   AUTHORIZATION INFORMATION Primary Insurance: 1D#: Group #:  Secondary Insurance: 1D#: Group #:  SCHEDULE INFORMATION: Date: 12/14/2022 Time: Location: [facility]

## 2022-12-02 LAB — CYTOLOGY - PAP
Adequacy: ABSENT
Comment: NEGATIVE
Diagnosis: NEGATIVE
High risk HPV: NEGATIVE

## 2022-12-09 ENCOUNTER — Encounter: Payer: Self-pay | Admitting: Gastroenterology

## 2022-12-14 ENCOUNTER — Encounter
Admission: RE | Disposition: A | Discharge status: Home / Self Care | Payer: Self-pay | Source: Ambulatory Visit | Attending: Gastroenterology

## 2022-12-14 ENCOUNTER — Ambulatory Visit: Payer: PRIVATE HEALTH INSURANCE | Admitting: Certified Registered Nurse Anesthetist

## 2022-12-14 ENCOUNTER — Ambulatory Visit
Admission: RE | Admit: 2022-12-14 | Discharge: 2022-12-14 | Disposition: A | Discharge status: Home / Self Care | Payer: PRIVATE HEALTH INSURANCE | Source: Ambulatory Visit | Attending: Gastroenterology | Admitting: Gastroenterology

## 2022-12-14 ENCOUNTER — Encounter: Payer: Self-pay | Admitting: Gastroenterology

## 2022-12-14 DIAGNOSIS — D126 Benign neoplasm of colon, unspecified: Secondary | ICD-10-CM | POA: Diagnosis not present

## 2022-12-14 DIAGNOSIS — Z1211 Encounter for screening for malignant neoplasm of colon: Secondary | ICD-10-CM

## 2022-12-14 DIAGNOSIS — K219 Gastro-esophageal reflux disease without esophagitis: Secondary | ICD-10-CM | POA: Insufficient documentation

## 2022-12-14 DIAGNOSIS — K449 Diaphragmatic hernia without obstruction or gangrene: Secondary | ICD-10-CM | POA: Diagnosis not present

## 2022-12-14 DIAGNOSIS — K5730 Diverticulosis of large intestine without perforation or abscess without bleeding: Secondary | ICD-10-CM | POA: Diagnosis not present

## 2022-12-14 DIAGNOSIS — D122 Benign neoplasm of ascending colon: Secondary | ICD-10-CM | POA: Insufficient documentation

## 2022-12-14 DIAGNOSIS — Z6841 Body Mass Index (BMI) 40.0 and over, adult: Secondary | ICD-10-CM | POA: Diagnosis not present

## 2022-12-14 HISTORY — PX: COLONOSCOPY WITH PROPOFOL: SHX5780

## 2022-12-14 SURGERY — COLONOSCOPY WITH PROPOFOL
Anesthesia: General

## 2022-12-14 MED ORDER — LIDOCAINE HCL (CARDIAC) PF 100 MG/5ML IV SOSY
PREFILLED_SYRINGE | INTRAVENOUS | Status: DC | PRN
  Administered 2022-12-14: 50 mg via INTRAVENOUS

## 2022-12-14 MED ORDER — PROPOFOL 10 MG/ML IV BOLUS
INTRAVENOUS | Status: DC | PRN
  Administered 2022-12-14: 80 mg via INTRAVENOUS

## 2022-12-14 MED ORDER — PROPOFOL 500 MG/50ML IV EMUL
INTRAVENOUS | Status: DC | PRN
  Administered 2022-12-14: 150 ug/kg/min via INTRAVENOUS

## 2022-12-14 MED ORDER — SODIUM CHLORIDE 0.9 % IV SOLN
INTRAVENOUS | Status: DC
  Administered 2022-12-14: 1000 mL via INTRAVENOUS

## 2022-12-14 NOTE — H&P (Signed)
Jonathon Bellows, MD [address], [place], [place], [place], [address] [address], [place], [place], [place], [address] Phone: [phone]  Fax: [phone]  Primary Care Physician:  Valerie Roys, DO   Pre-Procedure History & Physical: HPI:  Bailey Perry is a 46 y.o. female is here for an colonoscopy.   Past Medical History:  Diagnosis Date   Abnormal Pap smear of cervix    Anemia    with pregnancy only 27 years ago   GERD (gastroesophageal reflux disease)    Hiatal hernia     Past Surgical History:  Procedure Laterality Date   APPENDECTOMY     CESAREAN SECTION     X 2   KNEE ARTHROSCOPY Left 03/16/2022   Procedure: LEFT KNEE ARTHROSCOPY WITH DEBRIDEMENT AND EXCISION OF SYMPTOMATIC MEDIAL PLICA;  Surgeon: Corky Mull, MD;  Location: [facility] ORS;  Service: Orthopedics;  Laterality: Left;    Prior to Admission medications   Medication Sig Start Date End Date Taking? Authorizing Provider  JUNEL FE 1/20 1-20 MG-MCG tablet TAKE 1 TABLET BY MOUTH EVERY DAY 06/22/22  Yes Philip Aspen, CNM  acetaminophen (TYLENOL) 500 MG tablet Take 1,000 mg by mouth every 6 (six) hours as needed for moderate pain. Patient not taking: Reported on 11/29/2022    [provider]  gabapentin (NEURONTIN) 100 MG capsule Take 1 capsule (100 mg total) by mouth at bedtime. Patient not taking: Reported on 12/14/2022 11/29/22   Park Liter P, DO  HYDROcodone-acetaminophen (NORCO) 5-325 MG tablet Take 1-2 tablets by mouth every 6 (six) hours as needed for moderate pain or severe pain. MAXIMUM TOTAL ACETAMINOPHEN DOSE IS 4000 MG PER DAY Patient not taking: Reported on 11/29/2022 03/16/22   Poggi, Marshall Cork, MD  omeprazole (PRILOSEC OTC) 20 MG tablet Take 20 mg by mouth as needed. Patient not taking: Reported on 11/29/2022    [provider]    Allergies as of 11/30/2022 - Review Complete 11/29/2022  Allergen Reaction Noted   Iodine Itching 10/23/2015   Betadine [povidone  iodine] Swelling and Rash 01/18/2021   Omeprazole Rash 05/16/2019   Shellfish allergy Rash 10/11/2016    Family History  Problem Relation Age of Onset   Diabetes Mother    Heart disease Mother    Hypertension Mother    Cancer Father        Lymphoma   Arthritis Paternal Grandfather     Social History   Socioeconomic History   Marital status: Married    Spouse name: Not on file   Number of children: Not on file   Years of education: Not on file   Highest education level: Not on file  Occupational History   Not on file  Tobacco Use   Smoking status: Never   Smokeless tobacco: Never  Vaping Use   Vaping Use: Never used  Substance and Sexual Activity   Alcohol use: No   Drug use: No   Sexual activity: Yes    Birth control/protection: I.U.D.  Other Topics Concern   Not on file  Social History Narrative   Not on file   Social Determinants of Health   Financial Resource Strain: Not on file  Food Insecurity: Not on file  Transportation Needs: Not on file  Physical Activity: Not on file  Stress: Not on file  Social Connections: Not on file  Intimate Partner Violence: Not on file    Review of Systems: See HPI, otherwise negative ROS  Physical Exam: BP Marland Kitchen)  137/99   Pulse 74   Temp (!) 97.3 F (36.3 C) (Temporal)   Resp 18   Ht '5\' 3"'$  (1.6 m)   Wt 108 kg   LMP 11/27/2022 Comment: Pregnancy test negative  SpO2 100%   BMI 42.18 kg/m  General:   Alert,  pleasant and cooperative in NAD Head:  Normocephalic and atraumatic. Neck:  Supple; no masses or thyromegaly. Lungs:  Clear throughout to auscultation, normal respiratory effort.    Heart:  +S1, +S2, Regular rate and rhythm, No edema. Abdomen:  Soft, nontender and nondistended. Normal bowel sounds, without guarding, and without rebound.   Neurologic:  Alert and  oriented x4;  grossly normal neurologically.  Impression/Plan: Bailey Perry is here for an colonoscopy to be performed for Screening colonoscopy  average risk   Risks, benefits, limitations, and alternatives regarding  colonoscopy have been reviewed with the patient.  Questions have been answered.  All parties agreeable.   Jonathon Bellows, MD  12/14/2022, 8:09 AM

## 2022-12-14 NOTE — Transfer of Care (Signed)
Immediate Anesthesia Transfer of Care Note  Patient: Bailey Perry  Procedure(s) Performed: COLONOSCOPY WITH PROPOFOL  Patient Location: Endoscopy Unit  Anesthesia Type:General  Level of Consciousness: awake, alert , and drowsy  Airway & Oxygen Therapy: Patient Spontanous Breathing  Post-op Assessment: Report given to RN and Post -op Vital signs reviewed and stable  Post vital signs: Reviewed and stable  Last Vitals:  Vitals Value Taken Time  BP 101/72 12/14/22 0838  Temp    Pulse 75 12/14/22 0838  Resp 33 12/14/22 0838  SpO2 100 % 12/14/22 0838    Last Pain:  Vitals:   12/14/22 0751  TempSrc: Temporal  PainSc: 0-No pain         Complications: No notable events documented.

## 2022-12-14 NOTE — Anesthesia Preprocedure Evaluation (Signed)
Anesthesia Evaluation  Patient identified by MRN, date of birth, ID band Patient awake    Reviewed: Allergy & Precautions, NPO status , Patient's Chart, lab work & pertinent test results  History of Anesthesia Complications Negative for: history of anesthetic complications  Airway Mallampati: III  TM Distance: >3 FB Neck ROM: full    Dental no notable dental hx. (+) Dental Advidsory Given   Pulmonary neg pulmonary ROS, neg COPD   Pulmonary exam normal        Cardiovascular (-) Past MI and (-) CABG Normal cardiovascular exam     Neuro/Psych negative neurological ROS  negative psych ROS   GI/Hepatic Neg liver ROS, hiatal hernia,GERD  Medicated and Controlled,,  Endo/Other    Morbid obesity  Renal/GU      Musculoskeletal   Abdominal  (+) + obese  Peds  Hematology negative hematology ROS (+)   Anesthesia Other Findings Past Medical History: No date: Abnormal Pap smear of cervix No date: Anemia     Comment:  with pregnancy only 27 years ago No date: GERD (gastroesophageal reflux disease) No date: Hiatal hernia  Past Surgical History: No date: APPENDECTOMY No date: CESAREAN SECTION     Comment:  X 2  BMI    Body Mass Index: 39.32 kg/m      Reproductive/Obstetrics negative OB ROS                             Anesthesia Physical Anesthesia Plan  ASA: 3  Anesthesia Plan: General   Post-op Pain Management: Minimal or no pain anticipated   Induction: Intravenous  PONV Risk Score and Plan: 3 and Ondansetron, TIVA and Propofol infusion  Airway Management Planned: Nasal Cannula and Natural Airway  Additional Equipment: None  Intra-op Plan:   Post-operative Plan: Extubation in OR  Informed Consent: I have reviewed the patients History and Physical, chart, labs and discussed the procedure including the risks, benefits and alternatives for the proposed anesthesia with the patient  or authorized representative who has indicated his/her understanding and acceptance.     Dental Advisory Given  Plan Discussed with: Anesthesiologist, CRNA and Surgeon  Anesthesia Plan Comments: (Discussed risks of anesthesia with patient, including possibility of difficulty with spontaneous ventilation under anesthesia necessitating airway intervention, PONV, and rare risks such as cardiac or respiratory or neurological events, and allergic reactions. Discussed the role of CRNA in patient's perioperative care. Patient understands.)        Anesthesia Quick Evaluation

## 2022-12-14 NOTE — Anesthesia Procedure Notes (Signed)
Date/Time: 12/14/2022 8:16 AM  Performed by: Johnna Acosta, CRNAPre-anesthesia Checklist: Patient identified, Emergency Drugs available, Suction available, Patient being monitored and Timeout performed Patient Re-evaluated:Patient Re-evaluated prior to induction Oxygen Delivery Method: Nasal cannula Preoxygenation: Pre-oxygenation with 100% oxygen Induction Type: IV induction

## 2022-12-14 NOTE — Anesthesia Postprocedure Evaluation (Signed)
Anesthesia Post Note  Patient: Bailey Perry  Procedure(s) Performed: COLONOSCOPY WITH PROPOFOL  Patient location during evaluation: Endoscopy Anesthesia Type: General Level of consciousness: awake and alert Pain management: pain level controlled Vital Signs Assessment: post-procedure vital signs reviewed and stable Respiratory status: spontaneous breathing, nonlabored ventilation, respiratory function stable and patient connected to nasal cannula oxygen Cardiovascular status: blood pressure returned to baseline and stable Postop Assessment: no apparent nausea or vomiting Anesthetic complications: no  No notable events documented.   Last Vitals:  Vitals:   12/14/22 0846 12/14/22 0858  BP: (!) 130/91 116/86  Pulse:    Resp: 20   Temp:    SpO2:  100%    Last Pain:  Vitals:   12/14/22 0846  TempSrc:   PainSc: 0-No pain                 Dimas Millin

## 2022-12-14 NOTE — Op Note (Signed)
[facility] Gastroenterology Patient Name: Bailey Perry Procedure Date: 12/14/2022 8:06 AM MRN: [id number] Account #: [id number] Date of Birth: [birth year] Admit Type: Outpatient Age: 46 Room: [facility] ENDO ROOM 3 Gender: Female Note Status: Finalized Instrument Name: Jasper Riling [id number] Procedure:             Colonoscopy Indications:           Screening for colorectal malignant neoplasm Providers:             Jonathon Bellows MD, MD Referring MD:          Valerie Roys (Referring MD) Medicines:             Monitored Anesthesia Care Complications:         No immediate complications. Procedure:             Pre-Anesthesia Assessment:                        - Prior to the procedure, a History and Physical was                         performed, and patient medications, allergies and                         sensitivities were reviewed. The patient's tolerance                         of previous anesthesia was reviewed.                        - The risks and benefits of the procedure and the                         sedation options and risks were discussed with the                         patient. All questions were answered and informed                         consent was obtained.                        After obtaining informed consent, the colonoscope was                         passed under direct vision. Throughout the procedure,                         the patient's blood pressure, pulse, and oxygen                         saturations were monitored continuously. The                         Colonoscope was introduced through the anus and                         advanced to the the cecum, identified by the  appendiceal orifice. The colonoscopy was performed                         with ease. The patient tolerated the procedure well.                         The quality of the bowel preparation was excellent.                         The ileocecal  valve, appendiceal orifice, and rectum                         were photographed. Findings:      The perianal and digital rectal examinations were normal.      A 5 mm polyp was found in the ascending colon. The polyp was sessile.       The polyp was removed with a cold snare. Resection and retrieval were       complete.      A few small-mouthed diverticula were found in the left colon.      The exam was otherwise without abnormality on direct and retroflexion       views. Impression:            - One 5 mm polyp in the ascending colon, removed with                         a cold snare. Resected and retrieved.                        - Diverticulosis in the left colon.                        - The examination was otherwise normal on direct and                         retroflexion views. Recommendation:        - Discharge patient to home (with escort).                        - Resume previous diet.                        - Continue present medications.                        - Await pathology results.                        - Repeat colonoscopy for surveillance based on                         pathology results. Procedure Code(s):     --- Professional ---                        [phone], Colonoscopy, flexible; with removal of                         tumor(s), polyp(s), or other lesion(s) by snare  technique Diagnosis Code(s):     --- Professional ---                        Z12.11, Encounter for screening for malignant neoplasm                         of colon                        D12.2, Benign neoplasm of ascending colon                        K57.30, Diverticulosis of large intestine without                         perforation or abscess without bleeding CPT copyright 2022 American Medical Association. All rights reserved. The codes documented in this report are preliminary and upon coder review may  be revised to meet current compliance requirements. Jonathon Bellows,  MD Jonathon Bellows MD, MD 12/14/2022 8:35:22 AM This report has been signed electronically. Number of Addenda: 0 Note Initiated On: 12/14/2022 8:06 AM Scope Withdrawal Time: 0 hours 10 minutes 45 seconds  Total Procedure Duration: 0 hours 12 minutes 18 seconds  Estimated Blood Loss:  Estimated blood loss: none.      [facility]

## 2022-12-15 ENCOUNTER — Encounter: Payer: Self-pay | Admitting: Gastroenterology

## 2022-12-15 LAB — SURGICAL PATHOLOGY

## 2023-02-28 ENCOUNTER — Encounter: Payer: Self-pay | Admitting: Family Medicine

## 2023-02-28 ENCOUNTER — Ambulatory Visit (INDEPENDENT_AMBULATORY_CARE_PROVIDER_SITE_OTHER): Payer: PRIVATE HEALTH INSURANCE | Admitting: Family Medicine

## 2023-02-28 VITALS — BP 127/82 | HR 71 | Temp 98.4°F | Ht 63.0 in | Wt 224.3 lb

## 2023-02-28 DIAGNOSIS — B353 Tinea pedis: Secondary | ICD-10-CM | POA: Diagnosis not present

## 2023-02-28 MED ORDER — CLOTRIMAZOLE-BETAMETHASONE 1-0.05 % EX CREA: 1.0000 | TOPICAL_CREAM | Freq: Every day | CUTANEOUS | 0 refills | Status: DC

## 2023-02-28 NOTE — Progress Notes (Signed)
BP 127/82   Pulse 71   Temp 98.4 F (36.9 C) (Oral)   Ht '5\' 3"'$  (1.6 m)   Wt 224 lb 4.8 oz (101.7 kg)   SpO2 100%   BMI 39.73 kg/m    Subjective:    Patient ID: Bailey Perry, female    DOB: 29-May-1976, 47 y.o.   MRN: [id number]  HPI: Bailey Perry is a 47 y.o. female  Chief Complaint  Patient presents with   Hot Flashes   Didn't start the gabapentin. Still having some issues with hot flashes some nights, but doesn't want to start any medicine.   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 Has been using lamisil, which helps, but doesn't seem to get it to go away. It's been going on for years and has been tough. No other concerns or complaints at this time.   Relevant past medical, surgical, family and social history reviewed and updated as indicated. Interim medical history since our last visit reviewed. Allergies and medications reviewed and updated.  Review of Systems  Constitutional: Negative.   Respiratory: Negative.    Cardiovascular: Negative.   Gastrointestinal: Negative.   Musculoskeletal: Negative.   Skin:  Positive for rash. Negative for color change, pallor and wound.  Psychiatric/Behavioral: Negative.      Per HPI unless specifically indicated above     Objective:    BP 127/82   Pulse 71   Temp 98.4 F (36.9 C) (Oral)   Ht '5\' 3"'$  (1.6 m)   Wt 224 lb 4.8 oz (101.7 kg)   SpO2 100%   BMI 39.73 kg/m   Wt Readings from Last 3 Encounters:  02/28/23 224 lb 4.8 oz (101.7 kg)  12/14/22 238 lb 1.6 oz (108 kg)  11/29/22 225 lb 1.6 oz (102.1 kg)    Physical Exam Vitals and nursing note reviewed.  Constitutional:      General: She is not in acute distress.    Appearance: Normal appearance. She is not ill-appearing, toxic-appearing or diaphoretic.  HENT:     Head: Normocephalic and atraumatic.     Right Ear: External ear normal.     Left Ear: External ear normal.     Nose: Nose normal.      Mouth/Throat:     Mouth: Mucous membranes are moist.     Pharynx: Oropharynx is clear.  Eyes:     General: No scleral icterus.       Right eye: No discharge.        Left eye: No discharge.     Extraocular Movements: Extraocular movements intact.     Conjunctiva/sclera: Conjunctivae normal.     Pupils: Pupils are equal, round, and reactive to light.  Cardiovascular:     Rate and Rhythm: Normal rate and regular rhythm.     Pulses: Normal pulses.     Heart sounds: Normal heart sounds. No murmur heard.    No friction rub. No gallop.  Pulmonary:     Effort: Pulmonary effort is normal. No respiratory distress.     Breath sounds: Normal breath sounds. No stridor. No wheezing, rhonchi or rales.  Chest:     Chest wall: No tenderness.  Musculoskeletal:        General: Normal range of motion.     Cervical back: Normal range of motion and neck supple.  Skin:    General: Skin is warm and dry.     Capillary Refill: Capillary refill  takes less than 2 seconds.     Coloration: Skin is not jaundiced or pale.     Findings: No bruising, erythema, lesion or rash.     Comments: Tinea pedis between bilateral between 4th and 5th toes  Neurological:     General: No focal deficit present.     Mental Status: She is alert and oriented to person, place, and time. Mental status is at baseline.  Psychiatric:        Mood and Affect: Mood normal.        Behavior: Behavior normal.        Thought Content: Thought content normal.        Judgment: Judgment normal.     Results for orders placed or performed during the hospital encounter of 12/14/22  Surgical pathology  Result Value Ref Range   SURGICAL PATHOLOGY      SURGICAL PATHOLOGY CASE: [id number] PATIENT: [facility] Report     Specimen Submitted: A. Colon polyp, ascending; cold snare  Clinical History: Screening for colon cancer      DIAGNOSIS: A.  COLON POLYP, ASCENDING; COLD SNARE: - SESSILE SERRATED  POLYP. - NEGATIVE FOR DYSPLASIA AND MALIGNANCY.  GROSS DESCRIPTION: A. Labeled: Cold snare ascending colon polyp Received: Formalin Collection time: 8:25 AM on 12/14/2022 Placed into formalin time: 8:25 AM on 12/14/2022 Tissue fragment(s): 1 Size: 0.4 x 0.3 x 0.1 cm Description: Tan soft tissue fragment Entirely submitted in 1 cassette.  RB 12/14/2022  Final Diagnosis performed by Quay Burow, MD.   Electronically signed 12/15/2022 8:11:30AM The electronic signature indicates that the named Attending Pathologist has evaluated the specimen Technical component performed at [facility], [address] [place], [place] [address] Lab: [phone] Dir: Rush Farmer, MD, MMM  Professiona l component performed at [facility], [place], [place], [place], [place] [address] Lab: [phone] Dir: Kathi Simpers, MD       Assessment & Plan:   Problem List Items Addressed This Visit   None Visit Diagnoses     Tinea pedis of both feet    -  Primary   Will treat with lotrisone. Call with any concerns or if not getting better. Continue to monitor.   Relevant Medications   clotrimazole-betamethasone (LOTRISONE) cream        Follow up plan: Return in about 9 months (around 11/30/2023) for physical.

## 2023-06-14 IMAGING — MR MR KNEE*L* W/O CM
7 series · 40 of 40 positions shown · non-contrast
Comparison: 09/16/2016

CLINICAL DATA: Left knee pain.

EXAM:
MRI OF THE LEFT KNEE WITHOUT CONTRAST
TECHNIQUE: Multiplanar, multisequence MR imaging of the knee was performed. No
intravenous contrast was administered.

[Series 3: T2 fat-sat · axial · left · 4.0mm · 0.59mm/px · z∈[-83,+53]mm · 6 of 32 slices shown (1 of 3)]
[im 1/32]
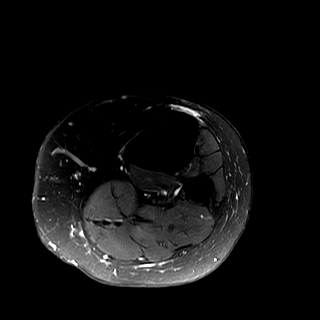
[im 7/32]
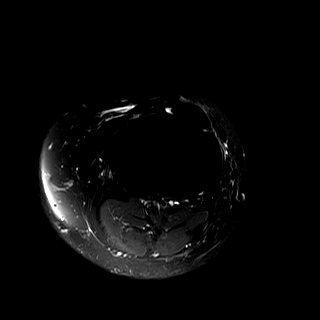
[im 13/32]
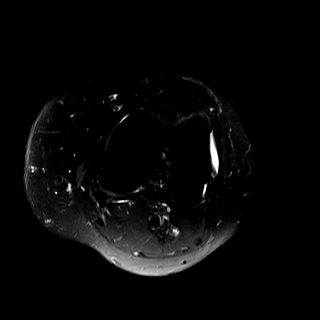
[im 19/32]
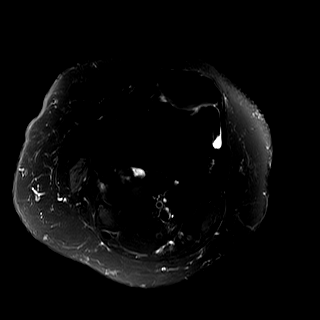
[im 25/32]
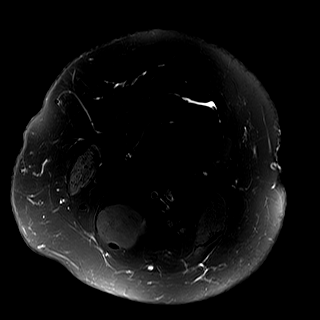
[im 32/32]
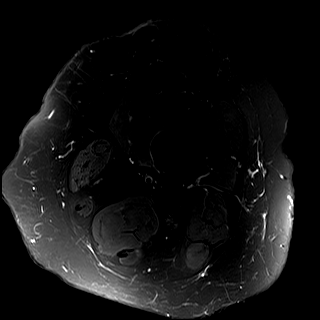

[Series 4: T2 fat-sat · coronal · left · 4.0mm · 0.56mm/px · 5 of 30 slices shown (2 of 3)]
[im 1/30]
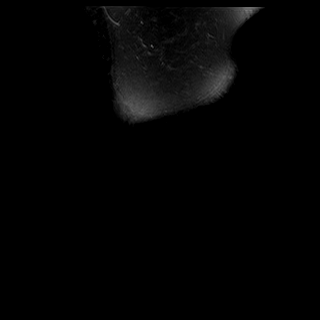
[im 8/30]
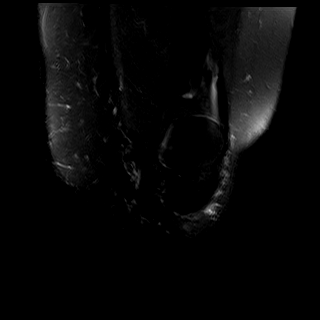
[im 15/30]
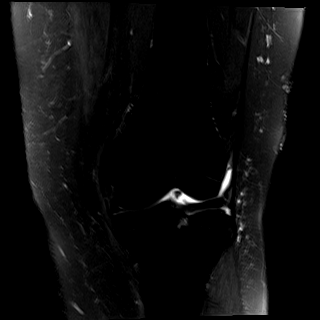
[im 22/30]
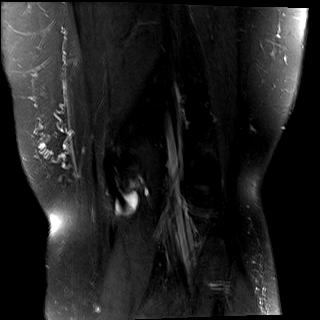
[im 30/30]
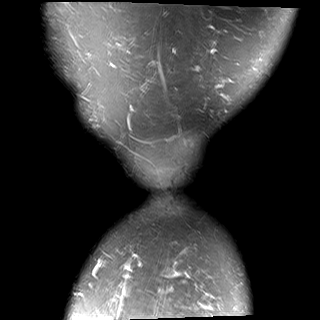

[Series 5: T1 · coronal · left · 4.0mm · 0.56mm/px · 5 of 30 slices shown]
[im 1/30]
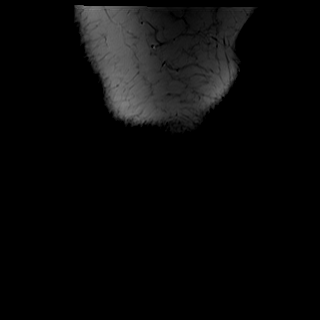
[im 8/30]
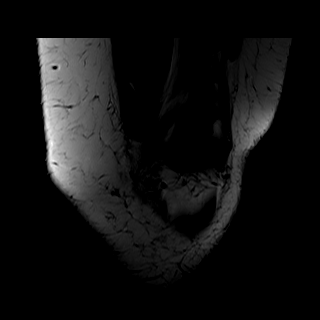
[im 15/30]
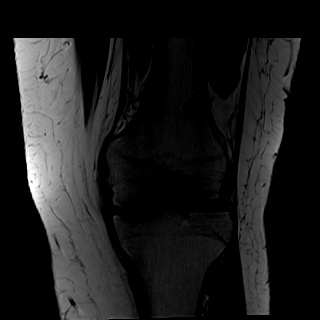
[im 22/30]
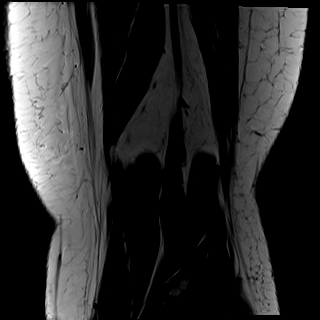
[im 30/30]
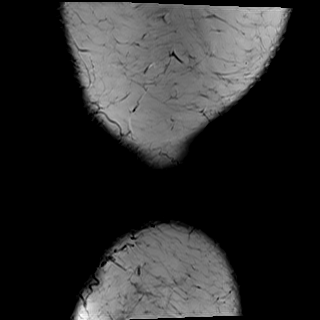

[Series 6: PD fat-sat · coronal · left · 3.0mm · 0.70mm/px · 6 of 34 slices shown (1 of 3)]
[im 1/34]
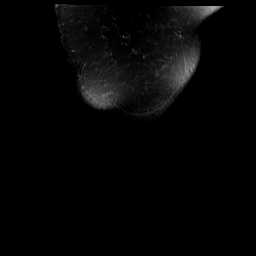
[im 7/34]
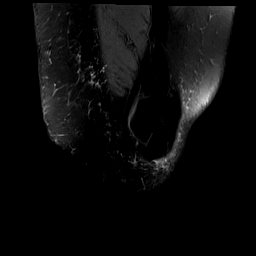
[im 14/34]
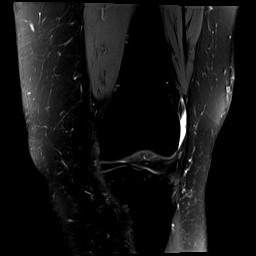
[im 20/34]
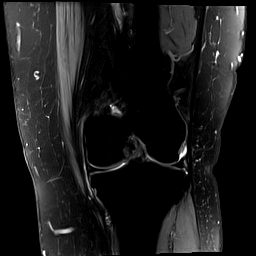
[im 27/34]
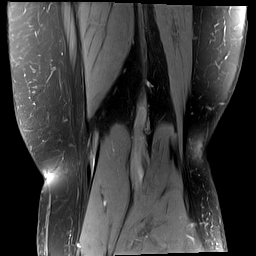
[im 34/34]
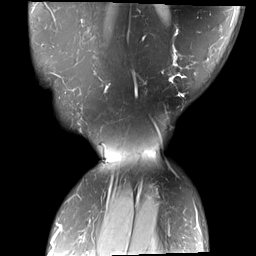

[Series 7: PD fat-sat · sagittal · left · 3.0mm · 0.56mm/px · 6 of 33 slices shown (2 of 3)]
[im 1/33]
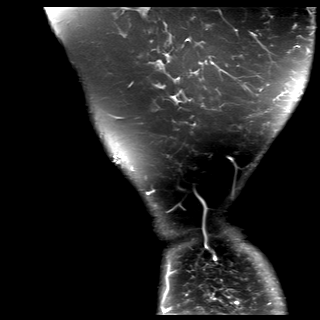
[im 7/33]
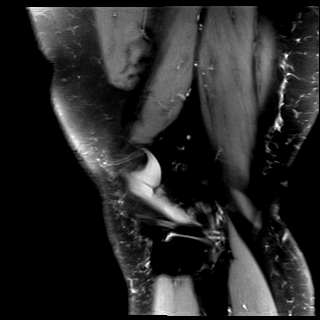
[im 13/33]
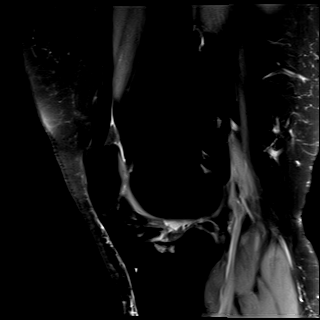
[im 20/33]
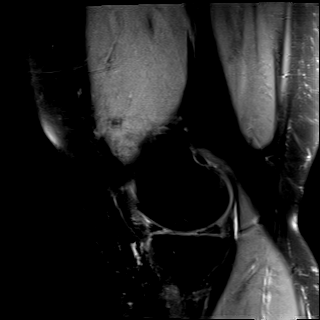
[im 26/33]
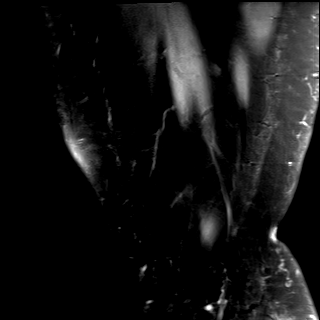
[im 33/33]
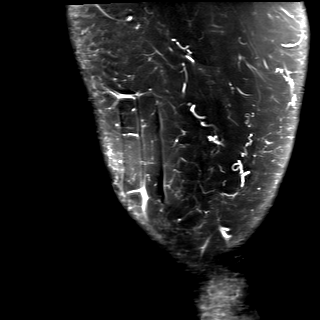

[Series 8: T2 fat-sat · sagittal · left · 3.0mm · 0.56mm/px · 6 of 33 slices shown (3 of 3)]
[im 1/33]
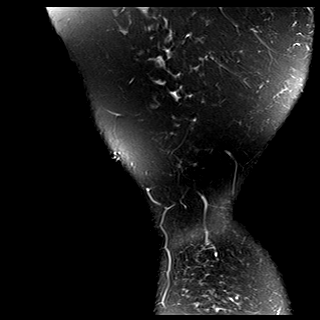
[im 7/33]
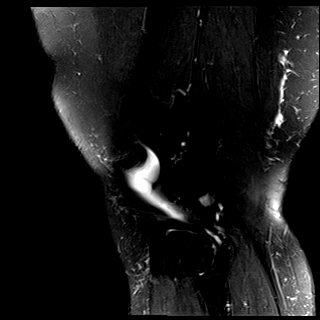
[im 13/33]
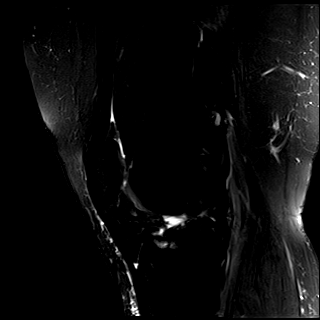
[im 20/33]
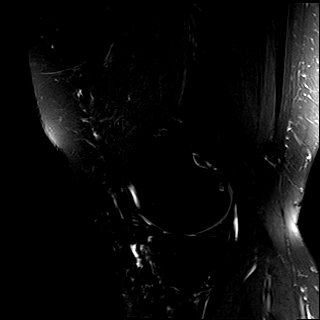
[im 26/33]
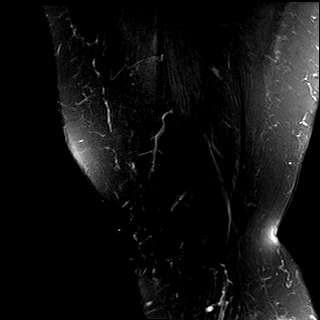
[im 33/33]
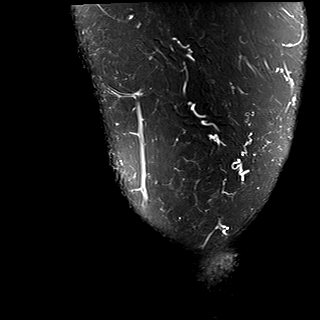

[Series 9: PD fat-sat · sagittal · left · 3.0mm · 0.56mm/px · 6 of 33 slices shown (3 of 3)]
[im 1/33]
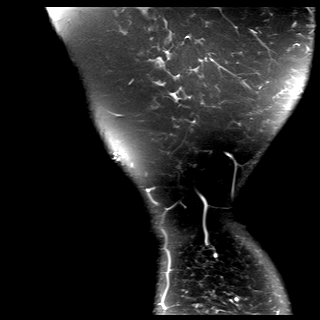
[im 7/33]
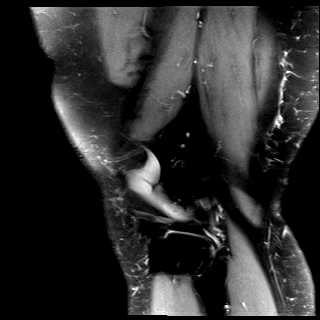
[im 13/33]
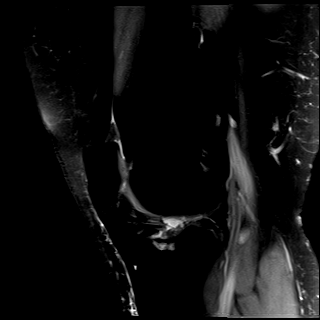
[im 20/33]
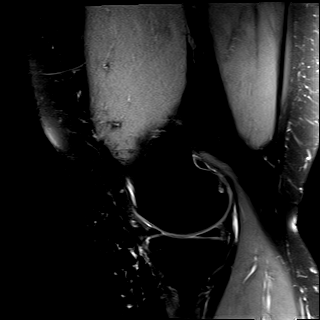
[im 26/33]
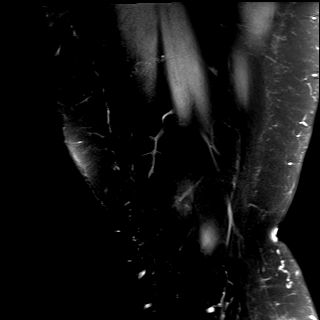
[im 33/33]
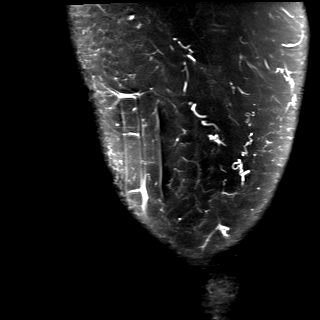

[40 of 40 positions shown; findings below may reference images not displayed]

FINDINGS: MENISCI

Medial: Intact.

Lateral: Intact.

LIGAMENTS

Cruciates: ACL and PCL are intact.

Collaterals: Medial collateral ligament is intact. Lateral
collateral ligament complex is intact.

CARTILAGE

Patellofemoral:  No chondral defect.

Medial:  No chondral defect.

Lateral:  No chondral defect.

JOINT: No joint effusion. Normal [place]. No plical
thickening.

POPLITEAL FOSSA: Popliteus tendon is intact. Small Baker's cyst.

EXTENSOR MECHANISM: Intact quadriceps tendon. Minimal tendinosis of
the proximal patellar tendon. Intact lateral patellar retinaculum.
Intact medial patellar retinaculum. Intact MPFL.

BONES: No aggressive osseous lesion. No fracture or dislocation.

Other: No fluid collection or hematoma. Muscles are normal.
IMPRESSION: 1. No meniscal or ligamentous injury of the left knee.
2. Minimal tendinosis of the proximal patellar tendon.

## 2023-07-06 ENCOUNTER — Other Ambulatory Visit: Payer: Self-pay | Admitting: Certified Nurse Midwife

## 2023-07-06 NOTE — Telephone Encounter (Signed)
I contacted the patient via phone. I left message asking the patient to call back to scheduled an appointment.

## 2023-07-10 ENCOUNTER — Other Ambulatory Visit: Payer: Self-pay | Admitting: Family Medicine

## 2023-07-10 NOTE — Telephone Encounter (Signed)
Medication Refill - Medication:  JUNEL FE 1/20 1-20 MG-MCG tablet Completely out & patient stated she gets this medication from PCP   Has the patient contacted their pharmacy? Yes.  Advised patient to contact PCP  Preferred Pharmacy (with phone number or street name):  [id number] [address] Phone: [phone] Fax: [phone]  Has the patient been seen for an appointment in the last year OR does the patient have an upcoming appointment? Yes.  F/U with PCP on 12.12.24 for CPE.   Patients callback # [phone]

## 2023-07-11 MED ORDER — NORETHIN ACE-ETH ESTRAD-FE 1-20 MG-MCG PO TABS: 1.0000 | ORAL_TABLET | Freq: Every day | ORAL | 3 refills | Status: DC

## 2023-07-11 NOTE — Telephone Encounter (Signed)
Requested medications are due for refill today.  yes  Requested medications are on the active medications list.  yes  Last refill. 06/22/2022 #[phone] rf  Future visit scheduled.   yes  Notes to clinic.  Rx signed by Doreene Burke    Requested Prescriptions  Pending Prescriptions Disp Refills   norethindrone-ethinyl estradiol-FE (JUNEL FE 1/20) 1-20 MG-MCG tablet 84 tablet 3    Sig: Take 1 tablet by mouth daily.     OB/GYN:  Contraceptives Passed - 07/10/2023 11:50 AM      Passed - Last BP in normal range    BP Readings from Last 1 Encounters:  02/28/23 127/82         Passed - Valid encounter within last 12 months    Recent Outpatient Visits           4 months ago Tinea pedis of both feet   [place] [facility] Odem, Megan P, DO   7 months ago Routine general medical examination at a health care facility   [facility] [place], [place] P, DO   2 years ago Acute bilateral thoracic back pain   [place] [facility] Caro Laroche, DO   2 years ago Routine general medical examination at a health care facility   [facility] [place], [place] P, DO   3 years ago Lateral epicondylitis of right elbow   [place] [facility] Dorcas Carrow, DO       Future Appointments             In 4 months Dorcas Carrow, DO [place] [facility]            Passed - Patient is not a smoker

## 2023-07-11 NOTE — Telephone Encounter (Signed)
Attempts to reach the patient has been unsuccessful.

## 2023-07-11 NOTE — Telephone Encounter (Signed)
I contact the patient via phone. I left message advising the patient to contact our office for scheduling.

## 2023-09-12 ENCOUNTER — Encounter: Payer: Self-pay | Admitting: Family Medicine

## 2023-09-12 ENCOUNTER — Ambulatory Visit: Payer: PRIVATE HEALTH INSURANCE | Admitting: Family Medicine

## 2023-09-12 ENCOUNTER — Ambulatory Visit: Payer: Self-pay | Admitting: *Deleted

## 2023-09-12 VITALS — BP 103/73 | HR 60 | Ht 63.0 in | Wt 227.0 lb

## 2023-09-12 DIAGNOSIS — G43109 Migraine with aura, not intractable, without status migrainosus: Secondary | ICD-10-CM

## 2023-09-12 MED ORDER — NURTEC 75 MG PO TBDP: 75.0000 mg | ORAL_TABLET | Freq: Every day | ORAL | 12 refills | Status: DC | PRN

## 2023-09-12 MED ORDER — KETOROLAC TROMETHAMINE 60 MG/2ML IM SOLN
60.0000 mg | Freq: Once | INTRAMUSCULAR | Status: AC
  Administered 2023-09-12: 60 mg via INTRAMUSCULAR

## 2023-09-12 NOTE — Telephone Encounter (Signed)
Pt had an appointment today

## 2023-09-12 NOTE — Telephone Encounter (Signed)
  Chief Complaint: migraine headache frontal area above eyes.  Symptoms: nausea, headache pain level 8 . 45 minutes ago reported visual disturbances of only seeing parts of a person and flashes of light and "flickering" vision better now but is in dark room  Frequency: 45 minutes ago  Pertinent Negatives: Patient denies visual issues now. No weakness on either side of body. No N/T reported. No dizziness or balance issues standing.  Disposition: [] ED /[] Urgent Care (no appt availability in office) / [x] Appointment(In office/virtual)/ []  [place] [facility]/ [] Home Care/ [] Refused Recommended Disposition /[] [place] [facility]/ []  Follow-up with PCP Additional Notes:   Able to get appt today . Recommended if sx worsen call 911 instead of coming to OV.      Reason for Disposition  [1] SEVERE headache (e.g., excruciating) AND [2] not improved after 2 hours of pain medicine  Answer Assessment - Initial Assessment Questions 1. LOCATION: "Where does it hurt?"      Front above eyes  2. ONSET: "When did the headache start?" (Minutes, hours or days)      45 minutes ago  3. PATTERN: "Does the pain come and go, or has it been constant since it started?"     Constant  4. SEVERITY: "How bad is the pain?" and "What does it keep you from doing?"  (e.g., Scale 1-10; mild, moderate, or severe)   - MILD (1-3): doesn't interfere with normal activities    - MODERATE (4-7): interferes with normal activities or awakens from sleep    - SEVERE (8-10): excruciating pain, unable to do any normal activities        Feels nausea  pain level 8 5. RECURRENT SYMPTOM: "Have you ever had headaches before?" If Yes, ask: "When was the last time?" and "What happened that time?"      Yes  6. CAUSE: "What do you think is causing the headache?"     Not sure  7. MIGRAINE: "Have you been diagnosed with migraine headaches?" If Yes, ask: "Is this headache similar?"      Yes  8. HEAD INJURY: "Has there been any recent  injury to the head?"      Na  9. OTHER SYMPTOMS: "Do you have any other symptoms?" (fever, stiff neck, eye pain, sore throat, cold symptoms)     Left side of neck pain , visual changes flashes of light was seeing only parts of a person now better and can see but is in dark room 10. PREGNANCY: "Is there any chance you are pregnant?" "When was your last menstrual period?"       na  Protocols used: [facility]

## 2023-09-12 NOTE — Progress Notes (Signed)
BP 103/73   Pulse 60   Ht 5\' 3"  (1.6 m)   Wt 227 lb (103 kg)   SpO2 99%   BMI 40.21 kg/m    Subjective:    Patient ID: Bailey Perry, female    DOB: 05-22-1976, 47 y.o.   MRN: [id number]  HPI: Bailey Perry is a 47 y.o. female  Chief Complaint  Patient presents with   Migraine    Patient is currently experiencing a migraine. Patient says when she first started having the migraine around 10 am. Patient says when she usually have migraines, she says her vision is always blurry, but this time she has flashing lights and was nauseous. Patient says now the symptoms have subsided, but she still has the migraine. Patient says she has tried Tylenol for the migraine.    MIGRAINES Duration: chronic Onset: sudden Severity: severe Quality: dull aching Frequency: 1x a month or every other month Location: behind her eye  Headache duration: 2-3 days Radiation: no Time of day headache occurs: at random Alleviating factors: nothing Aggravating factors: light Headache status at time of visit: current headache Treatments attempted: imitrex and maxalt   Aura: yes Nausea:  yes Vomiting: no Photophobia:  yes Phonophobia:  no Effect on social functioning:  yes Numbers of missed days of school/work each month: 0 Confusion:  no Gait disturbance/ataxia:  no Behavioral changes:  no Fevers:  no  Relevant past medical, surgical, family and social history reviewed and updated as indicated. Interim medical history since our last visit reviewed. Allergies and medications reviewed and updated.  Review of Systems  Constitutional: Negative.   Respiratory: Negative.    Cardiovascular: Negative.   Gastrointestinal: Negative.   Musculoskeletal: Negative.   Neurological:  Positive for headaches. Negative for dizziness, tremors, seizures, syncope, facial asymmetry, speech difficulty, weakness, light-headedness and numbness.  Hematological: Negative.   Psychiatric/Behavioral: Negative.       Per HPI unless specifically indicated above     Objective:    BP 103/73   Pulse 60   Ht 5\' 3"  (1.6 m)   Wt 227 lb (103 kg)   SpO2 99%   BMI 40.21 kg/m   Wt Readings from Last 3 Encounters:  09/12/23 227 lb (103 kg)  02/28/23 224 lb 4.8 oz (101.7 kg)  12/14/22 238 lb 1.6 oz (108 kg)    Physical Exam Vitals and nursing note reviewed.  Constitutional:      General: She is not in acute distress.    Appearance: Normal appearance. She is not ill-appearing, toxic-appearing or diaphoretic.     Comments: Uncomfortable appearing today  HENT:     Head: Normocephalic and atraumatic.     Right Ear: External ear normal.     Left Ear: External ear normal.     Nose: Nose normal.     Mouth/Throat:     Mouth: Mucous membranes are moist.     Pharynx: Oropharynx is clear.  Eyes:     General: No scleral icterus.       Right eye: No discharge.        Left eye: No discharge.     Extraocular Movements: Extraocular movements intact.     Conjunctiva/sclera: Conjunctivae normal.     Pupils: Pupils are equal, round, and reactive to light.  Cardiovascular:     Rate and Rhythm: Normal rate and regular rhythm.     Pulses: Normal pulses.     Heart sounds: Normal heart sounds. No murmur heard.  No friction rub. No gallop.  Pulmonary:     Effort: Pulmonary effort is normal. No respiratory distress.     Breath sounds: Normal breath sounds. No stridor. No wheezing, rhonchi or rales.  Chest:     Chest wall: No tenderness.  Musculoskeletal:        General: Normal range of motion.     Cervical back: Normal range of motion and neck supple.  Skin:    General: Skin is warm and dry.     Capillary Refill: Capillary refill takes less than 2 seconds.     Coloration: Skin is not jaundiced or pale.     Findings: No bruising, erythema, lesion or rash.  Neurological:     General: No focal deficit present.     Mental Status: She is alert and oriented to person, place, and time. Mental status is at  baseline.  Psychiatric:        Mood and Affect: Mood normal.        Behavior: Behavior normal.        Thought Content: Thought content normal.        Judgment: Judgment normal.     Results for orders placed or performed during the hospital encounter of 12/14/22  Surgical pathology  Result Value Ref Range   SURGICAL PATHOLOGY      SURGICAL PATHOLOGY CASE: [id number] PATIENT: [facility] Surgical Pathology Report     Specimen Submitted: A. Colon polyp, ascending; cold snare  Clinical History: Screening for colon cancer      DIAGNOSIS: A.  COLON POLYP, ASCENDING; COLD SNARE: - SESSILE SERRATED POLYP. - NEGATIVE FOR DYSPLASIA AND MALIGNANCY.  GROSS DESCRIPTION: A. Labeled: Cold snare ascending colon polyp Received: Formalin Collection time: 8:25 AM on 12/14/2022 Placed into formalin time: 8:25 AM on 12/14/2022 Tissue fragment(s): 1 Size: 0.4 x 0.3 x 0.1 cm Description: Tan soft tissue fragment Entirely submitted in 1 cassette.  RB 12/14/2022  Final Diagnosis performed by Elijah Birk, MD.   Electronically signed 12/15/2022 8:11:30AM The electronic signature indicates that the named Attending Pathologist has evaluated the specimen Technical component performed at [facility], [address] [place], [place] [address] Lab: [phone] Dir: Jolene Schimke, MD, MMM  Professiona l component performed at [facility], [address] [place], [place], [place] [address] Lab: [phone] Dir: Beryle Quant, MD       Assessment & Plan:   Problem List Items Addressed This Visit       Cardiovascular and Mediastinum   Migraine with aura and without status migrainosus, not intractable - Primary    In exacerbation. Will treat with toradol shot today and then start nurtec. Recheck at physical. If worsening consider MRI- stable now so will hold off today.       Relevant Medications   ketorolac (TORADOL) injection 60 mg   Rimegepant Sulfate  (NURTEC) 75 MG TBDP     Follow up plan: Return in about 3 months (around 12/01/2023) for Physical .

## 2023-09-12 NOTE — Assessment & Plan Note (Signed)
In exacerbation. Will treat with toradol shot today and then start nurtec. Recheck at physical. If worsening consider MRI- stable now so will hold off today.

## 2023-09-19 ENCOUNTER — Telehealth: Payer: Self-pay | Admitting: Family Medicine

## 2023-09-19 NOTE — Telephone Encounter (Signed)
Copied from CRM [id number]. Topic: General - Other >> Sep 19, 2023  3:30 PM Ja-Kwan M wrote: Reason for CRM: Pt reports that her pharmacy informed her that the Rimegepant Sulfate (NURTEC) 75 MG TBDP needs an approval. Cb# [phone]

## 2023-09-21 NOTE — Telephone Encounter (Signed)
Prior authorization has been initiated via CoverMyMeds for prescription Nurtec 75 MG. Awaiting determination from patient's insurance.

## 2023-10-03 NOTE — Telephone Encounter (Signed)
Spoke with patient and she said they denied the prescription for Nurtec since patient has not tried any other medications. Patient says she was not given any alternatives, but the provider can appeal the denial once the fax is received. Please advise?

## 2023-10-03 NOTE — Telephone Encounter (Signed)
Did they give alternatives?

## 2023-10-03 NOTE — Telephone Encounter (Signed)
Pt states she spoke w/ her insurance co who said they sent a denial letter on the 10/8.  However they did tell her you can dispute the denial.  It did not have the requirements that needed for the Rx.  If you have any updates for those requirements, please include and send a dispute. Pt states this medication works very well, and she would like to have this med.

## 2023-10-10 ENCOUNTER — Ambulatory Visit (INDEPENDENT_AMBULATORY_CARE_PROVIDER_SITE_OTHER): Payer: PRIVATE HEALTH INSURANCE | Admitting: Family Medicine

## 2023-10-10 ENCOUNTER — Encounter: Payer: Self-pay | Admitting: Family Medicine

## 2023-10-10 ENCOUNTER — Ambulatory Visit: Payer: Self-pay

## 2023-10-10 VITALS — BP 114/79 | HR 66 | Ht 63.0 in | Wt 228.4 lb

## 2023-10-10 DIAGNOSIS — J209 Acute bronchitis, unspecified: Secondary | ICD-10-CM

## 2023-10-10 MED ORDER — PREDNISONE 50 MG PO TABS: 50.0000 mg | ORAL_TABLET | Freq: Every day | ORAL | 0 refills | Status: DC

## 2023-10-10 NOTE — Progress Notes (Signed)
BP 114/79   Pulse 66   Ht 5\' 3"  (1.6 m)   Wt 228 lb 6.4 oz (103.6 kg)   SpO2 100%   BMI 40.46 kg/m    Subjective:    Patient ID: Bailey Perry, female    DOB: May 20, 1976, 47 y.o.   MRN: [id number]  HPI: Bailey Perry is a 47 y.o. female  Chief Complaint  Patient presents with   Cough    Patient says she is recently getting over a cold she has had for a week. Patient says she is having some issues breathing, patient says when she exerts herself such as walking, she is winded and when she sitting she has to keep taking deep breaths. Patient says she is sure for the cough and cold, but she wanted to discuss with provider. Patient says she has tried over the counter Cold Plus to help with the lingering symptoms.    Sore Throat    Patient says she has some sore throat as well. Patient says she does not have it anymore and says right now it just feels full. Patient says she has swabbed at home for COVID on Thursday and Friday and both test were negative.    UPPER RESPIRATORY TRACT INFECTION Duration: 10 days Worst symptom: cough and SOB Fever: no Cough: yes Shortness of breath: yes Wheezing: yes Chest pain: no Chest tightness: no Chest congestion: yes Nasal congestion: no Runny nose: yes Post nasal drip: yes Sneezing: no Sore throat: yes Swollen glands: no Sinus pressure: no Headache: no Face pain: no Toothache: no Ear pain: no  Ear pressure: no  Eyes red/itching:no Eye drainage/crusting: no  Vomiting: no Rash: no Fatigue: yes Sick contacts: yes Strep contacts: no  Context: better Recurrent sinusitis: no Relief with OTC cold/cough medications: no  Treatments attempted: alka-seltzer plus, dayquil   Relevant past medical, surgical, family and social history reviewed and updated as indicated. Interim medical history since our last visit reviewed. Allergies and medications reviewed and updated.  Review of Systems  Constitutional: Negative.   HENT:   Positive for congestion and postnasal drip. Negative for dental problem, drooling, ear discharge, ear pain, facial swelling, hearing loss, mouth sores, nosebleeds, rhinorrhea, sinus pressure, sinus pain, sneezing, sore throat, tinnitus, trouble swallowing and voice change.   Respiratory:  Positive for cough, shortness of breath and wheezing. Negative for apnea, choking, chest tightness and stridor.   Cardiovascular: Negative.   Gastrointestinal: Negative.   Skin: Negative.   Psychiatric/Behavioral: Negative.      Per HPI unless specifically indicated above     Objective:    BP 114/79   Pulse 66   Ht 5\' 3"  (1.6 m)   Wt 228 lb 6.4 oz (103.6 kg)   SpO2 100%   BMI 40.46 kg/m   Wt Readings from Last 3 Encounters:  10/10/23 228 lb 6.4 oz (103.6 kg)  09/12/23 227 lb (103 kg)  02/28/23 224 lb 4.8 oz (101.7 kg)    Physical Exam Vitals and nursing note reviewed.  Constitutional:      General: She is not in acute distress.    Appearance: Normal appearance. She is not ill-appearing, toxic-appearing or diaphoretic.  HENT:     Head: Normocephalic and atraumatic.     Right Ear: Tympanic membrane and external ear normal.     Left Ear: Tympanic membrane and external ear normal.     Nose: Nose normal.     Mouth/Throat:     Mouth: Mucous membranes are moist.  Pharynx: Oropharynx is clear.  Eyes:     General: No scleral icterus.       Right eye: No discharge.        Left eye: No discharge.     Extraocular Movements: Extraocular movements intact.     Conjunctiva/sclera: Conjunctivae normal.     Pupils: Pupils are equal, round, and reactive to light.  Cardiovascular:     Rate and Rhythm: Normal rate and regular rhythm.     Pulses: Normal pulses.     Heart sounds: Normal heart sounds. No murmur heard.    No friction rub. No gallop.  Pulmonary:     Effort: Pulmonary effort is normal. No respiratory distress.     Breath sounds: Normal breath sounds. No stridor. No wheezing, rhonchi or  rales.  Chest:     Chest wall: No tenderness.  Musculoskeletal:        General: Normal range of motion.     Cervical back: Normal range of motion and neck supple.  Skin:    General: Skin is warm and dry.     Capillary Refill: Capillary refill takes less than 2 seconds.     Coloration: Skin is not jaundiced or pale.     Findings: No bruising, erythema, lesion or rash.  Neurological:     General: No focal deficit present.     Mental Status: She is alert and oriented to person, place, and time. Mental status is at baseline.  Psychiatric:        Mood and Affect: Mood normal.        Behavior: Behavior normal.        Thought Content: Thought content normal.        Judgment: Judgment normal.     Results for orders placed or performed during the hospital encounter of 12/14/22  Surgical pathology  Result Value Ref Range   SURGICAL PATHOLOGY      SURGICAL PATHOLOGY CASE: [id number] PATIENT: Ceara [facility] Surgical Pathology Report     Specimen Submitted: A. Colon polyp, ascending; cold snare  Clinical History: Screening for colon cancer      DIAGNOSIS: A.  COLON POLYP, ASCENDING; COLD SNARE: - SESSILE SERRATED POLYP. - NEGATIVE FOR DYSPLASIA AND MALIGNANCY.  GROSS DESCRIPTION: A. Labeled: Cold snare ascending colon polyp Received: Formalin Collection time: 8:25 AM on 12/14/2022 Placed into formalin time: 8:25 AM on 12/14/2022 Tissue fragment(s): 1 Size: 0.4 x 0.3 x 0.1 cm Description: Tan soft tissue fragment Entirely submitted in 1 cassette.  RB 12/14/2022  Final Diagnosis performed by Elijah Birk, MD.   Electronically signed 12/15/2022 8:11:30AM The electronic signature indicates that the named Attending Pathologist has evaluated the specimen Technical component performed at [facility], [address] [place], [place] [address] Lab: [phone] Dir: Jolene Schimke, MD, MMM  Professiona l component performed at [facility], [facility], [address] [place], [place], [place] [address] Lab: [phone] Dir: Beryle Quant, MD       Assessment & Plan:   Problem List Items Addressed This Visit   None Visit Diagnoses     Acute bronchitis, unspecified organism    -  Primary   Will treat with burst of prednisone. Call if not getting better or getting worse.        Follow up plan: Return if symptoms worsen or fail to improve.

## 2023-10-10 NOTE — Telephone Encounter (Signed)
  Chief Complaint: feels like throat feels full Symptoms: , cough, runny nose and congestion, SOB at rest and exertion Frequency: 5 last Friday  Pertinent Negatives: Patient denies fever Disposition: [] ED /[] Urgent Care (no appt availability in office) / [x] Appointment(In office/virtual)/ []  [facility]/ [] Home Care/ [] Refused Recommended Disposition /[] [facility]/ []  Follow-up with PCP Additional Notes:  Reason for Disposition  Care advice for mild cough, questions about  Answer Assessment - Initial Assessment Questions 1. ONSET: "When did the nasal discharge start?"      Wednesday am   3. COUGH: "Do you have a cough?" If Yes, ask: "Describe the color of your sputum" (clear, white, yellow, green)     Yes- unable to bring up phlegm  4. RESPIRATORY DISTRESS: "Describe your breathing."      SOB rest  with exertion  5. FEVER: "Do you have a fever?" If Yes, ask: "What is your temperature, how was it measured, and when did it start?"     no 7. OTHER SYMPTOMS: "Do you have any other symptoms?" (e.g., sore throat, earache, wheezing, vomiting)     Feels full in throat. Congestion cannot get phlegm, cough , runny nose, 8. PREGNANCY: "Is there any chance you are pregnant?" "When was your last menstrual period?"     N/a  Protocols used: Common Cold-A-AH

## 2023-10-12 ENCOUNTER — Encounter: Payer: Self-pay | Admitting: Family Medicine

## 2023-11-30 ENCOUNTER — Encounter: Payer: No Typology Code available for payment source | Admitting: Family Medicine

## 2023-12-14 ENCOUNTER — Encounter (INDEPENDENT_AMBULATORY_CARE_PROVIDER_SITE_OTHER): Payer: No Typology Code available for payment source | Admitting: Family Medicine

## 2023-12-26 ENCOUNTER — Ambulatory Visit (INDEPENDENT_AMBULATORY_CARE_PROVIDER_SITE_OTHER): Payer: BC Managed Care – PPO | Admitting: Family Medicine

## 2023-12-26 ENCOUNTER — Encounter: Payer: Self-pay | Admitting: Family Medicine

## 2023-12-26 VITALS — BP 111/75 | HR 68 | Ht 63.0 in | Wt 226.8 lb

## 2023-12-26 DIAGNOSIS — E559 Vitamin D deficiency, unspecified: Secondary | ICD-10-CM

## 2023-12-26 DIAGNOSIS — Z23 Encounter for immunization: Secondary | ICD-10-CM

## 2023-12-26 DIAGNOSIS — G43109 Migraine with aura, not intractable, without status migrainosus: Secondary | ICD-10-CM | POA: Diagnosis not present

## 2023-12-26 DIAGNOSIS — B372 Candidiasis of skin and nail: Secondary | ICD-10-CM | POA: Diagnosis not present

## 2023-12-26 DIAGNOSIS — Z1231 Encounter for screening mammogram for malignant neoplasm of breast: Secondary | ICD-10-CM

## 2023-12-26 DIAGNOSIS — Z0000 Encounter for general adult medical examination without abnormal findings: Secondary | ICD-10-CM | POA: Diagnosis not present

## 2023-12-26 MED ORDER — NYSTATIN 100000 UNIT/GM EX POWD: 1.0000 | Freq: Three times a day (TID) | CUTANEOUS | 0 refills | Status: DC

## 2023-12-26 MED ORDER — NYSTATIN-TRIAMCINOLONE 100000-0.1 UNIT/GM-% EX OINT: 1.0000 | TOPICAL_OINTMENT | Freq: Two times a day (BID) | CUTANEOUS | 0 refills | Status: DC

## 2023-12-26 MED ORDER — NURTEC 75 MG PO TBDP: 75.0000 mg | ORAL_TABLET | Freq: Every day | ORAL | 12 refills | Status: DC | PRN

## 2023-12-26 NOTE — Assessment & Plan Note (Signed)
 Under good control on current regimen. Continue current regimen. Continue to monitor. Call with any concerns. Refills given.

## 2023-12-26 NOTE — Patient Instructions (Addendum)
 [place] Cohash Evening Primrose Oil  Your mammogram is scheduled for Tuesday 01/02/24 AT 10 AM  [facility]  Address: [address], [place], [place] [address] Phone: [phone]

## 2023-12-26 NOTE — Progress Notes (Signed)
 BP 111/75 (BP Location: Right Arm, Cuff Size: Normal)   Pulse 68   Ht 5' 3 (1.6 m)   Wt 226 lb 12.8 oz (102.9 kg)   SpO2 97%   BMI 40.18 kg/m    Subjective:    Patient ID: Bailey Perry, female    DOB: 10-07-1976, 48 y.o.   MRN: [id number]  HPI: Bailey Perry is a 48 y.o. female presenting on 12/26/2023 for comprehensive medical examination. Current medical complaints include:  Migraines are doing well. Happening rarely. Has had rash under her breasts and arms with sweating. No other concerns. Feeling well.   Menopausal Symptoms: yes  Depression Screen done today and results listed below:     12/26/2023   10:40 AM 10/10/2023    9:55 AM 09/12/2023    1:13 PM 02/28/2023    8:10 AM 11/29/2022   10:31 AM  Depression screen PHQ 2/9  Decreased Interest 0 0 0 1 0  Down, Depressed, Hopeless 0 0 0 0 0  PHQ - 2 Score 0 0 0 1 0  Altered sleeping 0 1 1 1 1   Tired, decreased energy 0 1 0 1 1  Change in appetite 0 0 0 0 0  Feeling bad or failure about yourself  0 0 0 0 0  Trouble concentrating 0 0 0 0 0  Moving slowly or fidgety/restless 0 0 0 0 0  Suicidal thoughts 0 0 0 0 0  PHQ-9 Score 0 2 1 3 2   Difficult doing work/chores Not difficult at all Not difficult at all Not difficult at all Not difficult at all Not difficult at all     Past Medical History:  Past Medical History:  Diagnosis Date   Abnormal Pap smear of cervix    Anemia    with pregnancy only 27 years ago   GERD (gastroesophageal reflux disease)    Hiatal hernia     Surgical History:  Past Surgical History:  Procedure Laterality Date   APPENDECTOMY     CESAREAN SECTION     X 2   COLONOSCOPY WITH PROPOFOL  N/A 12/14/2022   Procedure: COLONOSCOPY WITH PROPOFOL ;  Surgeon: Therisa Bi, MD;  Location: [facility] ENDOSCOPY;  Service: Gastroenterology;  Laterality: N/A;   KNEE ARTHROSCOPY Left 03/16/2022   Procedure: LEFT KNEE ARTHROSCOPY WITH DEBRIDEMENT AND EXCISION OF SYMPTOMATIC MEDIAL PLICA;  Surgeon: Edie Norleen JINNY, MD;  Location: [facility] ORS;  Service: Orthopedics;  Laterality: Left;    Medications:  Current Outpatient Medications on File Prior to Visit  Medication Sig   norethindrone-ethinyl estradiol-FE (JUNEL FE 1/20) 1-20 MG-MCG tablet Take 1 tablet by mouth daily.   No current facility-administered medications on file prior to visit.    Allergies:  Allergies  Allergen Reactions   Iodine Itching   Betadine [Povidone Iodine] Swelling and Rash   Omeprazole  Rash    Pt takes Prilosec with no problems   Shellfish Allergy Rash    Social History:  Social History   Socioeconomic History   Marital status: Married    Spouse name: Not on file   Number of children: Not on file   Years of education: Not on file   Highest education level: 12th grade  Occupational History   Not on file  Tobacco Use   Smoking status: Never   Smokeless tobacco: Never  Vaping Use   Vaping status: Never Used  Substance and Sexual Activity   Alcohol use: No   Drug use: No   Sexual activity: Yes  Birth control/protection: I.U.D.  Other Topics Concern   Not on file  Social History Narrative   Not on file   Social Drivers of Health   Financial Resource Strain: Medium Risk (12/22/2023)   Overall Financial Resource Strain (CARDIA)    Difficulty of Paying Living Expenses: Somewhat hard  Food Insecurity: Food Insecurity Present (12/22/2023)   Hunger Vital Sign    Worried About Running Out of Food in the Last Year: Sometimes true    Ran Out of Food in the Last Year: Never true  Transportation Needs: No Transportation Needs (12/22/2023)   PRAPARE - Administrator, Civil Service (Medical): No    Lack of Transportation (Non-Medical): No  Physical Activity: Unknown (12/22/2023)   Exercise Vital Sign    Days of Exercise per Week: 0 days    Minutes of Exercise per Session: Not on file  Stress: No Stress Concern Present (12/22/2023)   [employer] of Occupational Health - Occupational Stress  Questionnaire    Feeling of Stress : Only a little  Social Connections: Moderately Integrated (12/22/2023)   Social Connection and Isolation Panel [NHANES]    Frequency of Communication with Friends and Family: Never    Frequency of Social Gatherings with Friends and Family: Never    Attends Religious Services: 1 to 4 times per year    Active Member of [employer] or Organizations: Yes    Attends Engineer, Structural: 1 to 4 times per year    Marital Status: Married  Catering Manager Violence: Not on file   Social History   Tobacco Use  Smoking Status Never  Smokeless Tobacco Never   Social History   Substance and Sexual Activity  Alcohol Use No    Family History:  Family History  Problem Relation Age of Onset   Diabetes Mother    Heart disease Mother    Hypertension Mother    Cancer Father        Lymphoma   Arthritis Paternal Grandfather     Past medical history, surgical history, medications, allergies, family history and social history reviewed with patient today and changes made to appropriate areas of the chart.   Review of Systems  Constitutional:  Positive for diaphoresis. Negative for chills, fever, malaise/fatigue and weight loss.  HENT: Negative.    Eyes:  Positive for blurred vision. Negative for double vision, photophobia, pain, discharge and redness.  Respiratory: Negative.    Cardiovascular: Negative.   Gastrointestinal:  Positive for constipation and heartburn. Negative for abdominal pain, blood in stool, diarrhea, melena, nausea and vomiting.  Genitourinary: Negative.   Musculoskeletal: Negative.   Skin:  Positive for rash. Negative for itching.  Neurological: Negative.   Endo/Heme/Allergies:  Positive for environmental allergies. Negative for polydipsia. Does not bruise/bleed easily.  Psychiatric/Behavioral: Negative.     All other ROS negative except what is listed above and in the HPI.      Objective:    BP 111/75 (BP Location: Right Arm,  Cuff Size: Normal)   Pulse 68   Ht 5' 3 (1.6 m)   Wt 226 lb 12.8 oz (102.9 kg)   SpO2 97%   BMI 40.18 kg/m   Wt Readings from Last 3 Encounters:  12/26/23 226 lb 12.8 oz (102.9 kg)  10/10/23 228 lb 6.4 oz (103.6 kg)  09/12/23 227 lb (103 kg)    Physical Exam Vitals and nursing note reviewed.  Constitutional:      General: She is not in acute distress.  Appearance: Normal appearance. She is obese. She is not ill-appearing, toxic-appearing or diaphoretic.  HENT:     Head: Normocephalic and atraumatic.     Right Ear: Tympanic membrane, ear canal and external ear normal. There is no impacted cerumen.     Left Ear: Tympanic membrane, ear canal and external ear normal. There is no impacted cerumen.     Nose: Nose normal. No congestion or rhinorrhea.     Mouth/Throat:     Mouth: Mucous membranes are moist.     Pharynx: Oropharynx is clear. No oropharyngeal exudate or posterior oropharyngeal erythema.  Eyes:     General: No scleral icterus.       Right eye: No discharge.        Left eye: No discharge.     Extraocular Movements: Extraocular movements intact.     Conjunctiva/sclera: Conjunctivae normal.     Pupils: Pupils are equal, round, and reactive to light.  Neck:     Vascular: No carotid bruit.  Cardiovascular:     Rate and Rhythm: Normal rate and regular rhythm.     Pulses: Normal pulses.     Heart sounds: No murmur heard.    No friction rub. No gallop.  Pulmonary:     Effort: Pulmonary effort is normal. No respiratory distress.     Breath sounds: Normal breath sounds. No stridor. No wheezing, rhonchi or rales.  Chest:     Chest wall: No tenderness.  Abdominal:     General: Abdomen is flat. Bowel sounds are normal. There is no distension.     Palpations: Abdomen is soft. There is no mass.     Tenderness: There is no abdominal tenderness. There is no right CVA tenderness, left CVA tenderness, guarding or rebound.     Hernia: No hernia is present.  Genitourinary:     Comments: Breast and pelvic exams deferred with shared decision making Musculoskeletal:        General: No swelling, tenderness, deformity or signs of injury.     Cervical back: Normal range of motion and neck supple. No rigidity. No muscular tenderness.     Right lower leg: No edema.     Left lower leg: No edema.  Lymphadenopathy:     Cervical: No cervical adenopathy.  Skin:    General: Skin is warm and dry.     Capillary Refill: Capillary refill takes less than 2 seconds.     Coloration: Skin is not jaundiced or pale.     Findings: No bruising, erythema, lesion or rash.  Neurological:     General: No focal deficit present.     Mental Status: She is alert and oriented to person, place, and time. Mental status is at baseline.     Cranial Nerves: No cranial nerve deficit.     Sensory: No sensory deficit.     Motor: No weakness.     Coordination: Coordination normal.     Gait: Gait normal.     Deep Tendon Reflexes: Reflexes normal.  Psychiatric:        Mood and Affect: Mood normal.        Behavior: Behavior normal.        Thought Content: Thought content normal.        Judgment: Judgment normal.     Results for orders placed or performed during the hospital encounter of 12/14/22  Surgical pathology   Collection Time: 12/14/22  8:25 AM  Result Value Ref Range   SURGICAL PATHOLOGY  SURGICAL PATHOLOGY CASE: [id number] PATIENT: Katlynne Quinn Surgical Pathology Report     Specimen Submitted: A. Colon polyp, ascending; cold snare  Clinical History: Screening for colon cancer      DIAGNOSIS: A.  COLON POLYP, ASCENDING; COLD SNARE: - SESSILE SERRATED POLYP. - NEGATIVE FOR DYSPLASIA AND MALIGNANCY.  GROSS DESCRIPTION: A. Labeled: Cold snare ascending colon polyp Received: Formalin Collection time: 8:25 AM on 12/14/2022 Placed into formalin time: 8:25 AM on 12/14/2022 Tissue fragment(s): 1 Size: 0.4 x 0.3 x 0.1 cm Description: Tan soft tissue  fragment Entirely submitted in 1 cassette.  RB 12/14/2022  Final Diagnosis performed by Rexene Daily, MD.   Electronically signed 12/15/2022 8:11:30AM The electronic signature indicates that the named Attending Pathologist has evaluated the specimen Technical component performed at [facility], [address] [place], [place] [address] Lab: [phone] Dir: Frankey Sas, MD, MMM  Professiona l component performed at [facility], [facility], [address] [place], [place], [place] [address] Lab: [phone] Dir: Wilkie EMERSON Molt, MD       Assessment & Plan:   Problem List Items Addressed This Visit       Cardiovascular and Mediastinum   Migraine with aura and without status migrainosus, not intractable   Under good control on current regimen. Continue current regimen. Continue to monitor. Call with any concerns. Refills given.        Relevant Medications   Rimegepant Sulfate (NURTEC) 75 MG TBDP     Other   Vitamin D  deficiency   Rechecking labs today. Await results. Treat as needed.       Relevant Orders   VITAMIN D  25 Hydroxy (Vit-D Deficiency, Fractures)   Obesity, morbid (HCC)   Encouraged diet and exercise with goal of losing 1-2lbs per week. Call with any concerns.       Other Visit Diagnoses       Routine general medical examination at a health care facility    -  Primary   Vaccines up to date. Screening labs checked today. Pap up to date. Mammo ordered. Colonoscopy up to date. Continue diet and exercise. Call with any concerns.   Relevant Orders   CBC with Differential/Platelet   Comprehensive metabolic panel   Lipid Panel w/o Chol/HDL Ratio   TSH     Candidal intertrigo       Will treat with nystatin . Call with any concerns.   Relevant Medications   nystatin  (MYCOSTATIN /NYSTOP ) powder   nystatin -triamcinolone  ointment (MYCOLOG)     Needs flu shot       Flu shot given today.   Relevant Orders   Flu vaccine trivalent PF, 6mos and  older(Flulaval,Afluria,Fluarix,Fluzone) (Completed)     Encounter for screening mammogram for malignant neoplasm of breast       Mammogram ordered today.   Relevant Orders   MM 3D SCREENING MAMMOGRAM BILATERAL BREAST        Follow up plan: Return in about 1 year (around 12/25/2024) for physical.   LABORATORY TESTING:  - Pap smear: up to date  IMMUNIZATIONS:   - Tdap: Tetanus vaccination status reviewed: Tdap vaccination indicated and given today. - Influenza: Up to date - Pneumovax: Not applicable - Prevnar: Not applicable - COVID: Up to date - HPV: Not applicable - Shingrix  vaccine: Not applicable  SCREENING: -Mammogram: Ordered today  - Colonoscopy: Up to date   PATIENT COUNSELING:   Advised to take 1 mg of folate supplement per day if capable of pregnancy.   Sexuality: Discussed sexually transmitted diseases, partner selection,  use of condoms, avoidance of unintended pregnancy  and contraceptive alternatives.   Advised to avoid cigarette smoking.  I discussed with the patient that most people either abstain from alcohol or drink within safe limits (<=14/week and <=4 drinks/occasion for males, <=7/weeks and <= 3 drinks/occasion for females) and that the risk for alcohol disorders and other health effects rises proportionally with the number of drinks per week and how often a drinker exceeds daily limits.  Discussed cessation/primary prevention of drug use and availability of treatment for abuse.   Diet: Encouraged to adjust caloric intake to maintain  or achieve ideal body weight, to reduce intake of dietary saturated fat and total fat, to limit sodium intake by avoiding high sodium foods and not adding table salt, and to maintain adequate dietary potassium and calcium preferably from fresh fruits, vegetables, and low-fat dairy products.    stressed the importance of regular exercise  Injury prevention: Discussed safety belts, safety helmets, smoke detector, smoking near  bedding or upholstery.   Dental health: Discussed importance of regular tooth brushing, flossing, and dental visits.    NEXT PREVENTATIVE PHYSICAL DUE IN 1 YEAR. Return in about 1 year (around 12/25/2024) for physical.

## 2023-12-26 NOTE — Assessment & Plan Note (Signed)
 Encouraged diet and exercise with goal of losing 1-2lbs per week. Call with any concerns.

## 2023-12-26 NOTE — Assessment & Plan Note (Signed)
 Rechecking labs today. Await results. Treat as needed.

## 2023-12-27 LAB — COMPREHENSIVE METABOLIC PANEL
ALT: 14 [IU]/L (ref 0–32)
AST: 17 [IU]/L (ref 0–40)
Albumin: 3.9 g/dL (ref 3.9–4.9)
Alkaline Phosphatase: 81 [IU]/L (ref 44–121)
BUN/Creatinine Ratio: 9 (ref 9–23)
BUN: 9 mg/dL (ref 6–24)
Bilirubin Total: 0.5 mg/dL (ref 0.0–1.2)
CO2: 24 mmol/L (ref 20–29)
Calcium: 9.4 mg/dL (ref 8.7–10.2)
Chloride: 101 mmol/L (ref 96–106)
Creatinine, Ser: 0.96 mg/dL (ref 0.57–1.00)
Globulin, Total: 3.3 g/dL (ref 1.5–4.5)
Glucose: 92 mg/dL (ref 70–99)
Potassium: 4.3 mmol/L (ref 3.5–5.2)
Sodium: 138 mmol/L (ref 134–144)
Total Protein: 7.2 g/dL (ref 6.0–8.5)
eGFR: 73 mL/min/{1.73_m2} (ref >59)

## 2023-12-27 LAB — LIPID PANEL W/O CHOL/HDL RATIO
Cholesterol, Total: 210 mg/dL — ABNORMAL HIGH (ref 100–199)
HDL: 78 mg/dL (ref >39)
LDL Chol Calc (NIH): 122 mg/dL — ABNORMAL HIGH (ref 0–99)
Triglycerides: 55 mg/dL (ref 0–149)
VLDL Cholesterol Cal: 10 mg/dL (ref 5–40)

## 2023-12-27 LAB — CBC WITH DIFFERENTIAL/PLATELET
Basophils Absolute: 0.1 10*3/uL (ref 0.0–0.2)
Basos: 1 %
EOS (ABSOLUTE): 0 10*3/uL (ref 0.0–0.4)
Eos: 1 %
Hematocrit: 42.6 % (ref 34.0–46.6)
Hemoglobin: 14 g/dL (ref 11.1–15.9)
Immature Grans (Abs): 0 10*3/uL (ref 0.0–0.1)
Immature Granulocytes: 0 %
Lymphocytes Absolute: 1.8 10*3/uL (ref 0.7–3.1)
Lymphs: 30 %
MCH: 31.8 pg (ref 26.6–33.0)
MCHC: 32.9 g/dL (ref 31.5–35.7)
MCV: 97 fL (ref 79–97)
Monocytes Absolute: 0.5 10*3/uL (ref 0.1–0.9)
Monocytes: 8 %
Neutrophils Absolute: 3.7 10*3/uL (ref 1.4–7.0)
Neutrophils: 60 %
Platelets: 344 10*3/uL (ref 150–450)
RBC: 4.4 x10E6/uL (ref 3.77–5.28)
RDW: 12 % (ref 11.7–15.4)
WBC: 6.2 10*3/uL (ref 3.4–10.8)

## 2023-12-27 LAB — TSH: TSH: 0.951 u[IU]/mL (ref 0.450–4.500)

## 2023-12-27 LAB — VITAMIN D 25 HYDROXY (VIT D DEFICIENCY, FRACTURES): Vit D, 25-Hydroxy: 29 ng/mL — ABNORMAL LOW (ref 30.0–100.0)

## 2024-01-02 ENCOUNTER — Ambulatory Visit
Admission: RE | Admit: 2024-01-02 | Discharge: 2024-01-02 | Disposition: A | Discharge status: Home / Self Care | Payer: BC Managed Care – PPO | Source: Ambulatory Visit | Attending: Family Medicine | Admitting: Family Medicine

## 2024-01-02 DIAGNOSIS — Z1231 Encounter for screening mammogram for malignant neoplasm of breast: Secondary | ICD-10-CM | POA: Diagnosis present

## 2024-05-16 ENCOUNTER — Other Ambulatory Visit: Payer: Self-pay

## 2024-05-16 NOTE — Telephone Encounter (Signed)
 Copied from CRM [id number]. Topic: Clinical - Medication Question >> May 15, 2024  2:07 PM Essie A wrote: Reason for CRM: Patient is out of Rimegepant Sulfate (NURTEC) 75 MG TBDP and her insurance does not cover it.  Would like to get a generic brand.  Please return her call at [phone].

## 2024-05-17 ENCOUNTER — Telehealth: Payer: Self-pay

## 2024-05-17 NOTE — Telephone Encounter (Signed)
 PA submitted.

## 2024-05-17 NOTE — Telephone Encounter (Signed)
 Form received and prefilled, awaiting provider review and signature at this time.

## 2024-05-17 NOTE — Telephone Encounter (Signed)
 Copied from CRM [id number]. Topic: Clinical - Prescription Issue >> May 17, 2024 10:43 AM Phil Braun wrote: Reason for CRM:   EAV:[id number] Sulfate (NURTEC) 75 MG TBDP  [employer] [phone] Clinical team option 3 and then option 4 and finally option 2.  Needs, initial coverage information of this medication. Sent form over regarding this information.   Fax [phone] to return this form.

## 2024-05-17 NOTE — Telephone Encounter (Signed)
 There is not a generic of nurtec. Will need to do a PA

## 2024-05-31 NOTE — Telephone Encounter (Signed)
 Per CMM, not Siegfried Dress is required

## 2024-06-13 ENCOUNTER — Other Ambulatory Visit: Payer: Self-pay | Admitting: Nurse Practitioner

## 2024-06-14 NOTE — Telephone Encounter (Signed)
 Requested Prescriptions  Pending Prescriptions Disp Refills   norethindrone-ethinyl estradiol-FE (JUNEL FE 1/20) 1-20 MG-MCG tablet [Pharmacy Med Name: JUNEL FE 1 MG-20 MCG TABLET] 84 tablet 1    Sig: TAKE 1 TABLET BY MOUTH EVERY DAY     OB/GYN:  Contraceptives Failed - 06/14/2024 11:03 AM      Failed - Valid encounter within last 12 months    Recent Outpatient Visits   None     Future Appointments             In 6 months Johnson, Megan P, DO [facility], [facility]            Passed - Last BP in normal range    BP Readings from Last 1 Encounters:  12/26/23 111/75         Passed - Patient is not a smoker

## 2024-06-28 ENCOUNTER — Encounter: Payer: Self-pay | Admitting: Nurse Practitioner

## 2024-06-28 ENCOUNTER — Ambulatory Visit: Admitting: Nurse Practitioner

## 2024-06-28 VITALS — BP 115/75 | HR 76 | Temp 99.2°F | Wt 229.5 lb

## 2024-06-28 DIAGNOSIS — B351 Tinea unguium: Secondary | ICD-10-CM | POA: Diagnosis not present

## 2024-06-28 MED ORDER — CLOTRIMAZOLE-BETAMETHASONE 1-0.05 % EX CREA: 1.0000 | TOPICAL_CREAM | Freq: Every day | CUTANEOUS | 1 refills | Status: AC

## 2024-06-28 NOTE — Assessment & Plan Note (Signed)
 Acute, similar to in past.  Will send in Lotrisone  which worked well for her in the past.  Return as needed.

## 2024-06-28 NOTE — Progress Notes (Signed)
 BP 115/75   Pulse 76   Temp 99.2 F (37.3 C) (Oral)   Wt 229 lb 8 oz (104.1 kg)   SpO2 96%   BMI 40.65 kg/m    Subjective:    Patient ID: Bailey Perry, female    DOB: [birth year], 48 y.o.   MRN: [id number]  HPI: Bailey Perry is a 48 y.o. female  Chief Complaint  Patient presents with   Pain    Patient states she has been having an aching feeling in her R pinky toe off and on for the last 2 months. States she has had an issue with fungus in the past in the same area.    TOE PAIN Presents for right 5th toe aching.  Has had before and got cream from PCP and this helped. Was treated over a year ago. The pain is similar to last time Duration: months off and on for 2 months Involved toe: right pinky toe Mechanism of injury: unknown Onset: gradual Severity: 3/10  Quality: dull and aching Frequency: intermittent Radiation: no Aggravating factors: nothing  Alleviating factors: expired cream Status: stable Treatments attempted: expired cream  Relief with NSAIDs?: No NSAIDs Taken Morning stiffness: no Redness: no  Bruising: no Swelling: no Paresthesias / decreased sensation: no Fevers: no   Relevant past medical, surgical, family and social history reviewed and updated as indicated. Interim medical history since our last visit reviewed. Allergies and medications reviewed and updated.  Review of Systems  Constitutional:  Negative for activity change, appetite change, diaphoresis, fatigue and fever.  Respiratory:  Negative for cough, chest tightness and shortness of breath.   Cardiovascular:  Negative for chest pain, palpitations and leg swelling.  Gastrointestinal: Negative.   Neurological: Negative.   Psychiatric/Behavioral: Negative.      Per HPI unless specifically indicated above     Objective:    BP 115/75   Pulse 76   Temp 99.2 F (37.3 C) (Oral)   Wt 229 lb 8 oz (104.1 kg)   SpO2 96%   BMI 40.65 kg/m   Wt Readings from Last 3 Encounters:   06/28/24 229 lb 8 oz (104.1 kg)  12/26/23 226 lb 12.8 oz (102.9 kg)  10/10/23 228 lb 6.4 oz (103.6 kg)    Physical Exam Vitals and nursing note reviewed.  Constitutional:      General: She is awake. She is not in acute distress.    Appearance: She is well-developed and well-groomed. She is obese. She is not ill-appearing or toxic-appearing.  HENT:     Head: Normocephalic.     Right Ear: Hearing and external ear normal.     Left Ear: Hearing and external ear normal.  Eyes:     General: Lids are normal.        Right eye: No discharge.        Left eye: No discharge.     Conjunctiva/sclera: Conjunctivae normal.     Pupils: Pupils are equal, round, and reactive to light.  Neck:     Thyroid : No thyromegaly.     Vascular: No carotid bruit.  Cardiovascular:     Rate and Rhythm: Normal rate and regular rhythm.     Pulses:          Dorsalis pedis pulses are 2+ on the right side.       Posterior tibial pulses are 2+ on the right side.     Heart sounds: Normal heart sounds. No murmur heard.    No gallop.  Pulmonary:     Effort: Pulmonary effort is normal. No accessory muscle usage or respiratory distress.     Breath sounds: Normal breath sounds.  Abdominal:     General: Bowel sounds are normal. There is no distension.     Palpations: Abdomen is soft.     Tenderness: There is no abdominal tenderness.  Musculoskeletal:     Cervical back: Normal range of motion and neck supple.     Right lower leg: No edema.     Left lower leg: No edema.     Right foot: Normal range of motion.  Feet:     Right foot:     Protective Sensation: 10 sites tested.  10 sites sensed.     Toenail Condition: Fungal disease present. Lymphadenopathy:     Cervical: No cervical adenopathy.  Skin:    General: Skin is warm and dry.  Neurological:     Mental Status: She is alert and oriented to person, place, and time.     Deep Tendon Reflexes: Reflexes are normal and symmetric.     Reflex Scores:       Brachioradialis reflexes are 2+ on the right side and 2+ on the left side.      Patellar reflexes are 2+ on the right side and 2+ on the left side. Psychiatric:        Attention and Perception: Attention normal.        Mood and Affect: Mood normal.        Speech: Speech normal.        Behavior: Behavior normal. Behavior is cooperative.        Thought Content: Thought content normal.     Results for orders placed or performed in visit on 12/26/23  CBC with Differential/Platelet   Collection Time: 12/26/23 10:43 AM  Result Value Ref Range   WBC 6.2 3.4 - 10.8 x10E3/uL   RBC 4.40 3.77 - 5.28 x10E6/uL   Hemoglobin 14.0 11.1 - 15.9 g/dL   Hematocrit 57.3 65.9 - 46.6 %   MCV 97 79 - 97 fL   MCH 31.8 26.6 - 33.0 pg   MCHC 32.9 31.5 - 35.7 g/dL   RDW 87.9 88.2 - 84.5 %   Platelets 344 150 - 450 x10E3/uL   Neutrophils 60 Not Estab. %   Lymphs 30 Not Estab. %   Monocytes 8 Not Estab. %   Eos 1 Not Estab. %   Basos 1 Not Estab. %   Neutrophils Absolute 3.7 1.4 - 7.0 x10E3/uL   Lymphocytes Absolute 1.8 0.7 - 3.1 x10E3/uL   Monocytes Absolute 0.5 0.1 - 0.9 x10E3/uL   EOS (ABSOLUTE) 0.0 0.0 - 0.4 x10E3/uL   Basophils Absolute 0.1 0.0 - 0.2 x10E3/uL   Immature Granulocytes 0 Not Estab. %   Immature Grans (Abs) 0.0 0.0 - 0.1 x10E3/uL  Comprehensive metabolic panel   Collection Time: 12/26/23 10:43 AM  Result Value Ref Range   Glucose 92 70 - 99 mg/dL   BUN 9 6 - 24 mg/dL   Creatinine, Ser 9.03 0.57 - 1.00 mg/dL   eGFR 73 >40 fO/fpw/8.26   BUN/Creatinine Ratio 9 9 - 23   Sodium 138 134 - 144 mmol/L   Potassium 4.3 3.5 - 5.2 mmol/L   Chloride 101 96 - 106 mmol/L   CO2 24 20 - 29 mmol/L   Calcium 9.4 8.7 - 10.2 mg/dL   Total Protein 7.2 6.0 - 8.5 g/dL   Albumin 3.9 3.9 - 4.9 g/dL  Globulin, Total 3.3 1.5 - 4.5 g/dL   Bilirubin Total 0.5 0.0 - 1.2 mg/dL   Alkaline Phosphatase 81 44 - 121 IU/L   AST 17 0 - 40 IU/L   ALT 14 0 - 32 IU/L  Lipid Panel w/o Chol/HDL Ratio   Collection  Time: 12/26/23 10:43 AM  Result Value Ref Range   Cholesterol, Total 210 (H) 100 - 199 mg/dL   Triglycerides 55 0 - 149 mg/dL   HDL 78 >60 mg/dL   VLDL Cholesterol Cal 10 5 - 40 mg/dL   LDL Chol Calc (NIH) 877 (H) 0 - 99 mg/dL  TSH   Collection Time: 12/26/23 10:43 AM  Result Value Ref Range   TSH 0.951 0.450 - 4.500 uIU/mL  VITAMIN D  25 Hydroxy (Vit-D Deficiency, Fractures)   Collection Time: 12/26/23 10:43 AM  Result Value Ref Range   Vit D, 25-Hydroxy 29.0 (L) 30.0 - 100.0 ng/mL      Assessment & Plan:   Problem List Items Addressed This Visit       Musculoskeletal and Integument   Onychomycosis - Primary   Acute, similar to in past.  Will send in Lotrisone  which worked well for her in the past.  Return as needed.      Relevant Medications   clotrimazole -betamethasone  (LOTRISONE ) cream     Follow up plan: Return if symptoms worsen or fail to improve.

## 2024-06-28 NOTE — Patient Instructions (Signed)
 Preventing Toenail Fungus from Recurring   Sanitize your shoes with Mycomist spray or a similar shoe sanitizer spray.  Follow the instructions on the bottle and dry them outside in the sun or with a hairdryer.  We also recommend repeating the sanitization once weekly in shoes you wear most often.   Throw away any shoes you have worn a significant amount without socks-fungus thrives in a warm moist environment and you want to avoid re-infection after your laser procedure   Bleach your socks with regular or color safe bleach   Change your socks regularly to keep your feet clean and dry (especially if you have sweaty feet)-if sweaty feet are a problem, let your doctor know-there is a great lotion that helps with this problem.   Clean your toenail clippers with alcohol before you use them if you do your own toenails and make sure to replace Emory boards and orange sticks regularly   If you get regular pedicures, bring your own instruments or go to a spa that sterilizes their instruments in an autoclave.

## 2024-08-02 ENCOUNTER — Ambulatory Visit: Admitting: Family Medicine

## 2024-08-08 ENCOUNTER — Ambulatory Visit: Admitting: Family Medicine

## 2024-08-08 ENCOUNTER — Encounter: Payer: Self-pay | Admitting: Family Medicine

## 2024-08-08 VITALS — BP 130/84 | HR 71 | Temp 97.7°F | Ht 63.0 in | Wt 226.2 lb

## 2024-08-08 DIAGNOSIS — R0683 Snoring: Secondary | ICD-10-CM

## 2024-08-08 DIAGNOSIS — G43109 Migraine with aura, not intractable, without status migrainosus: Secondary | ICD-10-CM

## 2024-08-08 DIAGNOSIS — Z23 Encounter for immunization: Secondary | ICD-10-CM

## 2024-08-08 DIAGNOSIS — R030 Elevated blood-pressure reading, without diagnosis of hypertension: Secondary | ICD-10-CM | POA: Diagnosis not present

## 2024-08-08 NOTE — Progress Notes (Unsigned)
 BP 130/84   Pulse 71   Temp 97.7 F (36.5 C) (Oral)   Ht 5' 3 (1.6 m)   Wt 226 lb 3.2 oz (102.6 kg)   SpO2 98%   BMI 40.07 kg/m    Subjective:    Patient ID: Bailey Perry, female    DOB: September 08, 1976, 48 y.o.   MRN: [id number]  HPI: Bailey Perry is a 47 y.o. female  Chief Complaint  Patient presents with   Hypertension   Migraine   ELEVATED BLOOD PRESSURE Duration of elevated BP: about a month BP monitoring frequency: rarely BP range: 130s/80s Previous BP meds: none Recent stressors: no Family history of hypertension: yes Recurrent headaches: yes Visual changes: no Palpitations: no  Dyspnea: no Chest pain: no Lower extremity edema: no Dizzy/lightheaded: no Transient ischemic attacks: no  Has taken maxalt  and imitrex  in the past without significant improvement.   ???? SLEEP APNEA Sleep apnea status: suspected Duration: unsure Satisfied with current treatment?:  no CPAP use:  no Sleep quality with CPAP use: N/A Last sleep study: none Treatments attempted: none  Wakes feeling refreshed:  no Daytime hypersomnolence:  yes Fatigue:  yes Insomnia:  no Good sleep hygiene:  yes Difficulty falling asleep:  no Difficulty staying asleep:  no Snoring bothers bed partner:  yes Observed apnea by bed partner: yes Obesity:  yes Hypertension: no  Pulmonary hypertension:  no Coronary artery disease:  no   Relevant past medical, surgical, family and social history reviewed and updated as indicated. Interim medical history since our last visit reviewed. Allergies and medications reviewed and updated.  Review of Systems  Constitutional: Negative.   Respiratory: Negative.    Cardiovascular: Negative.   Musculoskeletal: Negative.   Skin: Negative.   Neurological:  Positive for headaches. Negative for dizziness, tremors, seizures, syncope, facial asymmetry, speech difficulty, weakness, light-headedness and numbness.  Psychiatric/Behavioral: Negative.       Per HPI unless specifically indicated above     Objective:    BP 130/84   Pulse 71   Temp 97.7 F (36.5 C) (Oral)   Ht 5' 3 (1.6 m)   Wt 226 lb 3.2 oz (102.6 kg)   SpO2 98%   BMI 40.07 kg/m   Wt Readings from Last 3 Encounters:  08/08/24 226 lb 3.2 oz (102.6 kg)  06/28/24 229 lb 8 oz (104.1 kg)  12/26/23 226 lb 12.8 oz (102.9 kg)    Physical Exam Vitals and nursing note reviewed.  Constitutional:      General: She is not in acute distress.    Appearance: Normal appearance. She is not ill-appearing, toxic-appearing or diaphoretic.  HENT:     Head: Normocephalic and atraumatic.     Right Ear: External ear normal.     Left Ear: External ear normal.     Nose: Nose normal.     Mouth/Throat:     Mouth: Mucous membranes are moist.     Pharynx: Oropharynx is clear.  Eyes:     General: No scleral icterus.       Right eye: No discharge.        Left eye: No discharge.     Extraocular Movements: Extraocular movements intact.     Conjunctiva/sclera: Conjunctivae normal.     Pupils: Pupils are equal, round, and reactive to light.  Cardiovascular:     Rate and Rhythm: Normal rate and regular rhythm.     Pulses: Normal pulses.     Heart sounds: Normal heart sounds. No murmur  heard.    No friction rub. No gallop.  Pulmonary:     Effort: Pulmonary effort is normal. No respiratory distress.     Breath sounds: Normal breath sounds. No stridor. No wheezing, rhonchi or rales.  Chest:     Chest wall: No tenderness.  Musculoskeletal:        General: Normal range of motion.     Cervical back: Normal range of motion and neck supple.  Skin:    General: Skin is warm and dry.     Capillary Refill: Capillary refill takes less than 2 seconds.     Coloration: Skin is not jaundiced or pale.     Findings: No bruising, erythema, lesion or rash.  Neurological:     General: No focal deficit present.     Mental Status: She is alert and oriented to person, place, and time. Mental status is  at baseline.  Psychiatric:        Mood and Affect: Mood normal.        Behavior: Behavior normal.        Thought Content: Thought content normal.        Judgment: Judgment normal.     Results for orders placed or performed in visit on 12/26/23  CBC with Differential/Platelet   Collection Time: 12/26/23 10:43 AM  Result Value Ref Range   WBC 6.2 3.4 - 10.8 x10E3/uL   RBC 4.40 3.77 - 5.28 x10E6/uL   Hemoglobin 14.0 11.1 - 15.9 g/dL   Hematocrit 57.3 65.9 - 46.6 %   MCV 97 79 - 97 fL   MCH 31.8 26.6 - 33.0 pg   MCHC 32.9 31.5 - 35.7 g/dL   RDW 87.9 88.2 - 84.5 %   Platelets 344 150 - 450 x10E3/uL   Neutrophils 60 Not Estab. %   Lymphs 30 Not Estab. %   Monocytes 8 Not Estab. %   Eos 1 Not Estab. %   Basos 1 Not Estab. %   Neutrophils Absolute 3.7 1.4 - 7.0 x10E3/uL   Lymphocytes Absolute 1.8 0.7 - 3.1 x10E3/uL   Monocytes Absolute 0.5 0.1 - 0.9 x10E3/uL   EOS (ABSOLUTE) 0.0 0.0 - 0.4 x10E3/uL   Basophils Absolute 0.1 0.0 - 0.2 x10E3/uL   Immature Granulocytes 0 Not Estab. %   Immature Grans (Abs) 0.0 0.0 - 0.1 x10E3/uL  Comprehensive metabolic panel   Collection Time: 12/26/23 10:43 AM  Result Value Ref Range   Glucose 92 70 - 99 mg/dL   BUN 9 6 - 24 mg/dL   Creatinine, Ser 9.03 0.57 - 1.00 mg/dL   eGFR 73 >40 fO/fpw/8.26   BUN/Creatinine Ratio 9 9 - 23   Sodium 138 134 - 144 mmol/L   Potassium 4.3 3.5 - 5.2 mmol/L   Chloride 101 96 - 106 mmol/L   CO2 24 20 - 29 mmol/L   Calcium 9.4 8.7 - 10.2 mg/dL   Total Protein 7.2 6.0 - 8.5 g/dL   Albumin 3.9 3.9 - 4.9 g/dL   Globulin, Total 3.3 1.5 - 4.5 g/dL   Bilirubin Total 0.5 0.0 - 1.2 mg/dL   Alkaline Phosphatase 81 44 - 121 IU/L   AST 17 0 - 40 IU/L   ALT 14 0 - 32 IU/L  Lipid Panel w/o Chol/HDL Ratio   Collection Time: 12/26/23 10:43 AM  Result Value Ref Range   Cholesterol, Total 210 (H) 100 - 199 mg/dL   Triglycerides 55 0 - 149 mg/dL   HDL 78 >60 mg/dL  VLDL Cholesterol Cal 10 5 - 40 mg/dL   LDL Chol Calc  (NIH) 122 (H) 0 - 99 mg/dL  TSH   Collection Time: 12/26/23 10:43 AM  Result Value Ref Range   TSH 0.951 0.450 - 4.500 uIU/mL  VITAMIN D  25 Hydroxy (Vit-D Deficiency, Fractures)   Collection Time: 12/26/23 10:43 AM  Result Value Ref Range   Vit D, 25-Hydroxy 29.0 (L) 30.0 - 100.0 ng/mL      Assessment & Plan:   Problem List Items Addressed This Visit       Cardiovascular and Mediastinum   Migraine with aura and without status migrainosus, not intractable   Has not been able to pick up her nurtec. Rx confirmed with pharmacy for pick up. Will start and recheck in about 4-6 weeks.       Other Visit Diagnoses       Elevated blood pressure reading without diagnosis of hypertension    -  Primary   BP normal. Microalbumin negative. Will try to get headaches under control and check sleep study. Follow up in about 4-6 weeks.   Relevant Orders   Microalbumin, Urine Waived     Snoring       Will check sleep study. Await results.   Relevant Orders   Ambulatory referral to Sleep Studies        Follow up plan: Return 4-6 weeks.

## 2024-08-08 NOTE — Assessment & Plan Note (Signed)
 Has not been able to pick up her nurtec. Rx confirmed with pharmacy for pick up. Will start and recheck in about 4-6 weeks.

## 2024-08-09 LAB — MICROALBUMIN, URINE WAIVED
Creatinine, Urine Waived: 300 mg/dL (ref 10–300)
Microalb, Ur Waived: 30 mg/L — ABNORMAL HIGH (ref 0–19)
Microalb/Creat Ratio: <30 mg/g (ref <30)

## 2024-09-03 ENCOUNTER — Ambulatory Visit: Admitting: Family Medicine

## 2024-09-04 ENCOUNTER — Encounter: Payer: Self-pay | Admitting: Family Medicine

## 2024-09-11 ENCOUNTER — Encounter: Payer: Self-pay | Admitting: Family Medicine

## 2024-09-11 ENCOUNTER — Encounter: Admitting: Nurse Practitioner

## 2024-09-11 DIAGNOSIS — G4733 Obstructive sleep apnea (adult) (pediatric): Secondary | ICD-10-CM | POA: Insufficient documentation

## 2024-09-18 ENCOUNTER — Other Ambulatory Visit: Payer: Self-pay | Admitting: Family Medicine

## 2024-09-18 ENCOUNTER — Encounter: Payer: Self-pay | Admitting: Family Medicine

## 2024-09-18 MED ORDER — ZEPBOUND 5 MG/0.5ML ~~LOC~~ SOAJ: 5.0000 mg | SUBCUTANEOUS | 1 refills | Status: DC

## 2024-09-18 MED ORDER — ZEPBOUND 2.5 MG/0.5ML ~~LOC~~ SOAJ: 2.5000 mg | SUBCUTANEOUS | 0 refills | Status: DC

## 2024-09-18 NOTE — Telephone Encounter (Signed)
 Scheduled

## 2024-09-19 NOTE — Telephone Encounter (Signed)
 Requested medication (s) are due for refill today: Pharmacy comment: Alternative Requested:PRIOR AUTH.   Requested medication (s) are on the active medication list: yes  Last refill:  09/18/24  Future visit scheduled: no  Notes to clinic:  Pharmacy comment: Alternative Requested:PRIOR AUTH.      Requested Prescriptions  Pending Prescriptions Disp Refills   ZEPBOUND 2.5 MG/0.5ML Pen [Pharmacy Med Name: ZEPBOUND 2.5 MG/0.5 ML PEN]  0    Sig: INJECT 2.5 MG SUBCUTANEOUSLY WEEKLY     Off-Protocol Failed - 09/19/2024  4:23 PM      Failed - Medication not assigned to a protocol, review manually.      Passed - Valid encounter within last 12 months    Recent Outpatient Visits           1 month ago Elevated blood pressure reading without diagnosis of hypertension   [facility] [place], Megan P, DO   2 months ago Onychomycosis   [place] [facility] [place], Melanie DASEN, NP       Future Appointments             In 3 months Vicci Duwaine SQUIBB, DO [facility], [address]

## 2024-09-20 ENCOUNTER — Ambulatory Visit: Admitting: Family Medicine

## 2024-09-20 ENCOUNTER — Other Ambulatory Visit (HOSPITAL_COMMUNITY): Payer: Self-pay

## 2024-09-20 ENCOUNTER — Telehealth: Payer: Self-pay

## 2024-09-20 NOTE — Telephone Encounter (Signed)
 This is for sleep apnea, not weight

## 2024-09-20 NOTE — Telephone Encounter (Signed)
 Pharmacy Patient Advocate Encounter   Received notification from Physician's Office that prior authorization for Zepbound 2.5mg /0.71ml Pen is required/requested.   Insurance verification completed.   The patient is insured through [facility].   Per test claim: Per test claim, medication is not covered due to plan/benefit exclusion, PA not submitted at this time

## 2024-09-20 NOTE — Telephone Encounter (Signed)
 Per test claim: Per test claim, medication is not covered due to plan/benefit exclusion, PA not submitted at this time

## 2024-09-23 ENCOUNTER — Other Ambulatory Visit: Payer: Self-pay

## 2024-09-24 ENCOUNTER — Encounter: Payer: Self-pay | Admitting: Family Medicine

## 2024-09-24 NOTE — Telephone Encounter (Signed)
 Pt needs appeal. Letter placed in provider's folder

## 2024-10-02 ENCOUNTER — Encounter: Payer: Self-pay | Admitting: Family Medicine

## 2024-11-05 ENCOUNTER — Encounter: Payer: Self-pay | Admitting: Family Medicine

## 2024-11-05 ENCOUNTER — Ambulatory Visit: Admitting: Family Medicine

## 2024-11-05 VITALS — BP 118/83 | HR 84 | Temp 98.0°F | Ht 63.0 in | Wt 229.6 lb

## 2024-11-05 DIAGNOSIS — Z23 Encounter for immunization: Secondary | ICD-10-CM

## 2024-11-05 DIAGNOSIS — G4733 Obstructive sleep apnea (adult) (pediatric): Secondary | ICD-10-CM

## 2024-11-05 NOTE — Progress Notes (Signed)
 BP 118/83   Pulse 84   Temp 98 F (36.7 C) (Oral)   Ht 5' 3 (1.6 m)   Wt 229 lb 9.6 oz (104.1 kg)   SpO2 95%   BMI 40.67 kg/m    Subjective:    Patient ID: Bailey Perry, female    DOB: 06-11-1976, 48 y.o.   MRN: [id number]  HPI: Bailey Perry is a 48 y.o. female  Chief Complaint  Patient presents with   Sleep Apnea   SLEEP APNEA Sleep apnea status: uncontrolled Duration: months Satisfied with current treatment?:  no CPAP use:  no Sleep quality with CPAP use: N/A Treament compliance:not on anything Last sleep study: September 2025 Treatments attempted: none Wakes feeling refreshed:  no Daytime hypersomnolence:  yes Fatigue:  yes Insomnia:  no Good sleep hygiene:  yes Difficulty falling asleep:  no Difficulty staying asleep:  no Snoring bothers bed partner:  yes Observed apnea by bed partner: yes Obesity:  yes Hypertension: no  Pulmonary hypertension:  no Coronary artery disease:  no   Relevant past medical, surgical, family and social history reviewed and updated as indicated. Interim medical history since our last visit reviewed. Allergies and medications reviewed and updated.  Review of Systems  Constitutional: Negative.   Respiratory: Negative.    Cardiovascular: Negative.   Musculoskeletal: Negative.   Neurological: Negative.   Psychiatric/Behavioral: Negative.      Per HPI unless specifically indicated above     Objective:    BP 118/83   Pulse 84   Temp 98 F (36.7 C) (Oral)   Ht 5' 3 (1.6 m)   Wt 229 lb 9.6 oz (104.1 kg)   SpO2 95%   BMI 40.67 kg/m   Wt Readings from Last 3 Encounters:  11/05/24 229 lb 9.6 oz (104.1 kg)  08/08/24 226 lb 3.2 oz (102.6 kg)  06/28/24 229 lb 8 oz (104.1 kg)    Physical Exam Vitals and nursing note reviewed.  Constitutional:      General: She is not in acute distress.    Appearance: Normal appearance. She is not ill-appearing, toxic-appearing or diaphoretic.  HENT:     Head:  Normocephalic and atraumatic.     Right Ear: External ear normal.     Left Ear: External ear normal.     Nose: Nose normal.     Mouth/Throat:     Mouth: Mucous membranes are moist.     Pharynx: Oropharynx is clear.  Eyes:     General: No scleral icterus.       Right eye: No discharge.        Left eye: No discharge.     Extraocular Movements: Extraocular movements intact.     Conjunctiva/sclera: Conjunctivae normal.     Pupils: Pupils are equal, round, and reactive to light.  Cardiovascular:     Rate and Rhythm: Normal rate and regular rhythm.     Pulses: Normal pulses.     Heart sounds: Normal heart sounds. No murmur heard.    No friction rub. No gallop.  Pulmonary:     Effort: Pulmonary effort is normal. No respiratory distress.     Breath sounds: Normal breath sounds. No stridor. No wheezing, rhonchi or rales.  Chest:     Chest wall: No tenderness.  Musculoskeletal:        General: Normal range of motion.     Cervical back: Normal range of motion and neck supple.  Skin:    General: Skin is warm and  dry.     Capillary Refill: Capillary refill takes less than 2 seconds.     Coloration: Skin is not jaundiced or pale.     Findings: No bruising, erythema, lesion or rash.  Neurological:     General: No focal deficit present.     Mental Status: She is alert and oriented to person, place, and time. Mental status is at baseline.  Psychiatric:        Mood and Affect: Mood normal.        Behavior: Behavior normal.        Thought Content: Thought content normal.        Judgment: Judgment normal.     Results for orders placed or performed in visit on 08/08/24  Microalbumin, Urine Waived   Collection Time: 08/08/24  4:20 PM  Result Value Ref Range   Microalb, Ur Waived 30 (H) 0 - 19 mg/L   Creatinine, Urine Waived 300 10 - 300 mg/dL   Microalb/Creat Ratio <30 <30 mg/g      Assessment & Plan:   Problem List Items Addressed This Visit       Respiratory   OSA (obstructive  sleep apnea) - Primary   Has not gotten her CPAP or zepbound. Will resubmit and try to get her on zepbound. Recheck in January as scheduled. Call with any concerns.       Other Visit Diagnoses       Needs flu shot       Flu shot given today.   Relevant Orders   Flu vaccine trivalent PF, 6mos and older(Flulaval,Afluria,Fluarix,Fluzone) (Completed)        Follow up plan: Return for As scheduled.

## 2024-11-05 NOTE — Assessment & Plan Note (Signed)
 Has not gotten her CPAP or zepbound. Will resubmit and try to get her on zepbound. Recheck in January as scheduled. Call with any concerns.

## 2024-11-20 ENCOUNTER — Other Ambulatory Visit: Payer: Self-pay | Admitting: Family Medicine

## 2024-11-25 ENCOUNTER — Encounter: Payer: Self-pay | Admitting: Family Medicine

## 2024-11-25 NOTE — Telephone Encounter (Signed)
 Requested Prescriptions  Pending Prescriptions Disp Refills   norethindrone-ethinyl estradiol-FE (JUNEL FE 1/20) 1-20 MG-MCG tablet [Pharmacy Med Name: JUNEL FE 1 MG-20 MCG TABLET] 84 tablet 0    Sig: TAKE 1 TABLET BY MOUTH EVERY DAY     OB/GYN:  Contraceptives Passed - 11/25/2024  8:12 AM      Passed - Last BP in normal range    BP Readings from Last 1 Encounters:  11/05/24 118/83         Passed - Valid encounter within last 12 months    Recent Outpatient Visits           2 weeks ago OSA (obstructive sleep apnea)   [place] [facility] [place], Megan P, DO   3 months ago Elevated blood pressure reading without diagnosis of hypertension   [place] [facility] [place], Megan P, DO   5 months ago Onychomycosis   [place] [facility] [place], Melanie DASEN, NP       Future Appointments             In 1 month Johnson, Duwaine SQUIBB, DO [place] [facility], [address] - Patient is not a smoker

## 2024-12-07 ENCOUNTER — Encounter: Payer: Self-pay | Admitting: Family Medicine

## 2024-12-12 ENCOUNTER — Encounter: Payer: Self-pay | Admitting: Family Medicine

## 2024-12-13 MED ORDER — NURTEC 75 MG PO TBDP: 75.0000 mg | ORAL_TABLET | Freq: Every day | ORAL | 12 refills | Status: AC | PRN

## 2024-12-23 ENCOUNTER — Encounter: Payer: Self-pay | Admitting: Family Medicine

## 2024-12-23 ENCOUNTER — Other Ambulatory Visit (HOSPITAL_COMMUNITY): Payer: Self-pay

## 2024-12-23 ENCOUNTER — Other Ambulatory Visit: Payer: Self-pay | Admitting: Family Medicine

## 2024-12-24 ENCOUNTER — Other Ambulatory Visit: Payer: Self-pay

## 2024-12-24 ENCOUNTER — Telehealth: Payer: Self-pay

## 2024-12-24 ENCOUNTER — Other Ambulatory Visit (HOSPITAL_COMMUNITY): Payer: Self-pay

## 2024-12-24 NOTE — Telephone Encounter (Signed)
 Requested medications are due for refill today.  See note  Requested medications are on the active medications list.  yes  Last refill. 12/23/2024  Future visit scheduled.   yes  Notes to clinic.  Pharmacy comment: Alternative Requested:REQUIRES PRIOR AUTH FOR INSURANCE TO COVER.     Requested Prescriptions  Pending Prescriptions Disp Refills   NURTEC 75 MG TBDP [Pharmacy Med Name: NURTEC ODT 75 MG TABLET] 9 tablet 12    Sig: Take 1 tablet (75 mg total) by mouth daily as needed (migraine).     Off-Protocol Failed - 12/24/2024  4:53 PM      Failed - Medication not assigned to a protocol, review manually.      Passed - Valid encounter within last 12 months    Recent Outpatient Visits           1 month ago OSA (obstructive sleep apnea)   [place] [facility] [place], Megan P, DO   4 months ago Elevated blood pressure reading without diagnosis of hypertension   [place] [facility] [place], Megan P, DO   5 months ago Onychomycosis   [place] [facility] [place], Melanie DASEN, NP       Future Appointments             In 1 week Vicci Duwaine SQUIBB, DO [place] [facility], [address]

## 2024-12-24 NOTE — Telephone Encounter (Signed)
 Pharmacy Patient Advocate Encounter   Received notification from Patient Advice Request messages that prior authorization for Nurtec 75MG  ODT TABS is required/requested.   Insurance verification completed.   The patient is insured through [employer] [facility].   Per test claim: The current 18 day co-pay is, $0.00.  No PA needed at this time. This test claim was processed through [facility]- copay amounts may vary at other pharmacies due to pharmacy/plan contracts, or as the patient moves through the different stages of their insurance plan.      -Called pharmacy will have it ready.

## 2024-12-31 ENCOUNTER — Ambulatory Visit: Payer: Self-pay | Admitting: Family Medicine

## 2024-12-31 VITALS — BP 136/88 | HR 71 | Temp 98.0°F | Ht 63.0 in | Wt 229.4 lb

## 2024-12-31 DIAGNOSIS — Z1231 Encounter for screening mammogram for malignant neoplasm of breast: Secondary | ICD-10-CM | POA: Diagnosis not present

## 2024-12-31 DIAGNOSIS — Z23 Encounter for immunization: Secondary | ICD-10-CM

## 2024-12-31 DIAGNOSIS — R030 Elevated blood-pressure reading, without diagnosis of hypertension: Secondary | ICD-10-CM

## 2024-12-31 DIAGNOSIS — Z0000 Encounter for general adult medical examination without abnormal findings: Secondary | ICD-10-CM | POA: Diagnosis not present

## 2024-12-31 DIAGNOSIS — E559 Vitamin D deficiency, unspecified: Secondary | ICD-10-CM

## 2024-12-31 DIAGNOSIS — G43109 Migraine with aura, not intractable, without status migrainosus: Secondary | ICD-10-CM | POA: Diagnosis not present

## 2024-12-31 LAB — MICROALBUMIN, URINE WAIVED
Creatinine, Urine Waived: 200 mg/dL (ref 10–300)
Microalb, Ur Waived: 30 mg/L — ABNORMAL HIGH (ref 0–19)
Microalb/Creat Ratio: <30 mg/g

## 2024-12-31 MED ORDER — JUNEL FE 1/20 1-20 MG-MCG PO TABS: 1.0000 | ORAL_TABLET | Freq: Every day | ORAL | 3 refills | Status: AC

## 2024-12-31 NOTE — Assessment & Plan Note (Signed)
 Will start oral wegovy- Written Rx given today. Follow up 6 weeks. Call with any concerns.

## 2024-12-31 NOTE — Assessment & Plan Note (Signed)
 Rechecking labs today. Await results. Treat as needed.

## 2024-12-31 NOTE — Assessment & Plan Note (Signed)
Under good control on current regimen. Continue current regimen. Continue to monitor. Call with any concerns. Refills up to date.   

## 2024-12-31 NOTE — Progress Notes (Signed)
 "  BP 136/88   Pulse 71   Temp 98 F (36.7 C) (Oral)   Ht 5' 3 (1.6 m)   Wt 229 lb 6.4 oz (104.1 kg)   SpO2 98%   BMI 40.64 kg/m    Subjective:    Patient ID: Bailey Perry, female    DOB: 1976-09-30, 49 y.o.   MRN: [id number]  HPI: Bailey Perry is a 49 y.o. female presenting on 12/31/2024 for comprehensive medical examination. Current medical complaints include:  OBESITY Duration: chronic Previous attempts at weight loss: diet, exercise, weight loss Complications of obesity: OSA Peak weight: 229lbs (current) Weight loss goal: to be healthy Weight loss to date: none Requesting obesity pharmacotherapy: yes Current weight loss supplements/medications: no Previous weight loss supplements/meds: no  Migraines has been doing well on the nurtec. Feeling well. Hasn't had as many migraines since starting it.   Menopausal Symptoms: yes  Depression Screen done today and results listed below:     11/05/2024    3:40 PM 06/28/2024    2:31 PM 12/26/2023   10:40 AM 10/10/2023    9:55 AM 09/12/2023    1:13 PM  Depression screen PHQ 2/9  Decreased Interest 0 0 0 0 0  Down, Depressed, Hopeless 0 0 0 0 0  PHQ - 2 Score 0 0 0 0 0  Altered sleeping 0 0 0 1 1  Tired, decreased energy 0 0 0 1 0  Change in appetite 0 0 0 0 0  Feeling bad or failure about yourself  0 0 0 0 0  Trouble concentrating 0 0 0 0 0  Moving slowly or fidgety/restless 0 0 0 0 0  Suicidal thoughts 0 0 0 0 0  PHQ-9 Score 0 0  0  2  1   Difficult doing work/chores Not difficult at all Not difficult at all Not difficult at all Not difficult at all Not difficult at all     Data saved with a previous flowsheet row definition    Past Medical History:  Past Medical History:  Diagnosis Date   Abnormal Pap smear of cervix    Anemia    with pregnancy only 27 years ago   GERD (gastroesophageal reflux disease)    Hiatal hernia     Surgical History:  Past Surgical History:  Procedure Laterality Date    APPENDECTOMY     CESAREAN SECTION     X 2   COLONOSCOPY WITH PROPOFOL  N/A 12/14/2022   Procedure: COLONOSCOPY WITH PROPOFOL ;  Surgeon: Therisa Bi, MD;  Location: [facility] ENDOSCOPY;  Service: Gastroenterology;  Laterality: N/A;   KNEE ARTHROSCOPY Left 03/16/2022   Procedure: LEFT KNEE ARTHROSCOPY WITH DEBRIDEMENT AND EXCISION OF SYMPTOMATIC MEDIAL PLICA;  Surgeon: Edie Norleen JINNY, MD;  Location: [facility] ORS;  Service: Orthopedics;  Laterality: Left;    Medications:  Current Outpatient Medications on File Prior to Visit  Medication Sig   Cholecalciferol (VITAMIN D -1000 MAX ST) 25 MCG (1000 UT) tablet Take by mouth.   clotrimazole -betamethasone  (LOTRISONE ) cream Apply 1 Application topically daily.   Rimegepant Sulfate (NURTEC) 75 MG TBDP Take 1 tablet (75 mg total) by mouth daily as needed (migraine).   No current facility-administered medications on file prior to visit.    Allergies:  Allergies[1]  Social History:  Social History   Socioeconomic History   Marital status: Married    Spouse name: Not on file   Number of children: Not on file   Years of education: Not on file  Highest education level: 12th grade  Occupational History   Not on file  Tobacco Use   Smoking status: Never   Smokeless tobacco: Never  Vaping Use   Vaping status: Never Used  Substance and Sexual Activity   Alcohol use: Never   Drug use: Never   Sexual activity: Yes    Birth control/protection: Pill  Other Topics Concern   Not on file  Social History Narrative   Not on file   Social Drivers of Health   Tobacco Use: Low Risk (12/31/2024)   Patient History    Smoking Tobacco Use: Never    Smokeless Tobacco Use: Never    Passive Exposure: Not on file  Financial Resource Strain: Low Risk (12/31/2024)   Overall Financial Resource Strain (CARDIA)    Difficulty of Paying Living Expenses: Not hard at all  Food Insecurity: No Food Insecurity (12/31/2024)   Epic    Worried About Programme Researcher, Broadcasting/film/video in the  Last Year: Never true    Ran Out of Food in the Last Year: Never true  Transportation Needs: No Transportation Needs (12/31/2024)   Epic    Lack of Transportation (Medical): No    Lack of Transportation (Non-Medical): No  Physical Activity: Inactive (12/31/2024)   Exercise Vital Sign    Days of Exercise per Week: 0 days    Minutes of Exercise per Session: Not on file  Stress: No Stress Concern Present (12/31/2024)   [employer] of Occupational Health - Occupational Stress Questionnaire    Feeling of Stress: Only a little  Social Connections: Socially Integrated (12/31/2024)   Social Connection and Isolation Panel    Frequency of Communication with Friends and Family: More than three times a week    Frequency of Social Gatherings with Friends and Family: Once a week    Attends Religious Services: 1 to 4 times per year    Active Member of [employer] or Organizations: Yes    Attends Banker Meetings: More than 4 times per year    Marital Status: Married  Catering Manager Violence: Not At Risk (12/31/2024)   Epic    Fear of Current or Ex-Partner: No    Emotionally Abused: No    Physically Abused: No    Sexually Abused: No  Depression (PHQ2-9): Low Risk (11/05/2024)   Depression (PHQ2-9)    PHQ-2 Score: 0  Alcohol Screen: Low Risk (12/31/2024)   Alcohol Screen    Last Alcohol Screening Score (AUDIT): 0  Housing: Low Risk (12/31/2024)   Epic    Unable to Pay for Housing in the Last Year: No    Number of Times Moved in the Last Year: 0    Homeless in the Last Year: No  Utilities: Not on file  Health Literacy: Not on file   Tobacco Use History[2] Social History   Substance and Sexual Activity  Alcohol Use Never    Family History:  Family History  Problem Relation Age of Onset   Diabetes Mother    Heart disease Mother    Hypertension Mother    Cancer Father        Lymphoma   Arthritis Paternal Grandfather     Past medical history, surgical history,  medications, allergies, family history and social history reviewed with patient today and changes made to appropriate areas of the chart.   Review of Systems  Constitutional: Negative.   HENT: Negative.    Eyes: Negative.   Respiratory: Negative.    Cardiovascular:  Positive for  leg swelling. Negative for chest pain, palpitations, orthopnea, claudication and PND.  Gastrointestinal:  Positive for constipation and heartburn. Negative for abdominal pain, blood in stool, diarrhea, melena, nausea and vomiting.  Genitourinary: Negative.   Musculoskeletal: Negative.   Skin: Negative.   Neurological: Negative.   Endo/Heme/Allergies: Negative.   Psychiatric/Behavioral: Negative.     All other ROS negative except what is listed above and in the HPI.      Objective:    BP 136/88   Pulse 71   Temp 98 F (36.7 C) (Oral)   Ht 5' 3 (1.6 m)   Wt 229 lb 6.4 oz (104.1 kg)   SpO2 98%   BMI 40.64 kg/m   Wt Readings from Last 3 Encounters:  12/31/24 229 lb 6.4 oz (104.1 kg)  11/05/24 229 lb 9.6 oz (104.1 kg)  08/08/24 226 lb 3.2 oz (102.6 kg)    Physical Exam Vitals and nursing note reviewed.  Constitutional:      General: She is not in acute distress.    Appearance: Normal appearance. She is obese. She is not ill-appearing, toxic-appearing or diaphoretic.  HENT:     Head: Normocephalic and atraumatic.     Right Ear: Tympanic membrane, ear canal and external ear normal. There is no impacted cerumen.     Left Ear: Tympanic membrane, ear canal and external ear normal. There is no impacted cerumen.     Nose: Nose normal. No congestion or rhinorrhea.     Mouth/Throat:     Mouth: Mucous membranes are moist.     Pharynx: Oropharynx is clear. No oropharyngeal exudate or posterior oropharyngeal erythema.  Eyes:     General: No scleral icterus.       Right eye: No discharge.        Left eye: No discharge.     Extraocular Movements: Extraocular movements intact.     Conjunctiva/sclera:  Conjunctivae normal.     Pupils: Pupils are equal, round, and reactive to light.  Neck:     Vascular: No carotid bruit.  Cardiovascular:     Rate and Rhythm: Normal rate and regular rhythm.     Pulses: Normal pulses.     Heart sounds: No murmur heard.    No friction rub. No gallop.  Pulmonary:     Effort: Pulmonary effort is normal. No respiratory distress.     Breath sounds: Normal breath sounds. No stridor. No wheezing, rhonchi or rales.  Chest:     Chest wall: No tenderness.  Abdominal:     General: Abdomen is flat. Bowel sounds are normal. There is no distension.     Palpations: Abdomen is soft. There is no mass.     Tenderness: There is no abdominal tenderness. There is no right CVA tenderness, left CVA tenderness, guarding or rebound.     Hernia: No hernia is present.  Genitourinary:    Comments: Breast and pelvic exams deferred with shared decision making Musculoskeletal:        General: No swelling, tenderness, deformity or signs of injury.     Cervical back: Normal range of motion and neck supple. No rigidity. No muscular tenderness.     Right lower leg: No edema.     Left lower leg: No edema.  Lymphadenopathy:     Cervical: No cervical adenopathy.  Skin:    General: Skin is warm and dry.     Capillary Refill: Capillary refill takes less than 2 seconds.     Coloration: Skin is not jaundiced  or pale.     Findings: No bruising, erythema, lesion or rash.  Neurological:     General: No focal deficit present.     Mental Status: She is alert and oriented to person, place, and time. Mental status is at baseline.     Cranial Nerves: No cranial nerve deficit.     Sensory: No sensory deficit.     Motor: No weakness.     Coordination: Coordination normal.     Gait: Gait normal.     Deep Tendon Reflexes: Reflexes normal.  Psychiatric:        Mood and Affect: Mood normal.        Behavior: Behavior normal.        Thought Content: Thought content normal.        Judgment:  Judgment normal.     Results for orders placed or performed in visit on 08/08/24  Microalbumin, Urine Waived   Collection Time: 08/08/24  4:20 PM  Result Value Ref Range   Microalb, Ur Waived 30 (H) 0 - 19 mg/L   Creatinine, Urine Waived 300 10 - 300 mg/dL   Microalb/Creat Ratio <30 <30 mg/g      Assessment & Plan:   Problem List Items Addressed This Visit       Cardiovascular and Mediastinum   Migraine with aura and without status migrainosus, not intractable   Under good control on current regimen. Continue current regimen. Continue to monitor. Call with any concerns. Refills up to date.         Other   Vitamin D  deficiency   Rechecking labs today. Await results. Treat as needed.       Relevant Orders   VITAMIN D  25 Hydroxy (Vit-D Deficiency, Fractures)   Obesity, morbid (HCC)   Will start oral wegovy- Written Rx given today. Follow up 6 weeks. Call with any concerns.       Other Visit Diagnoses       Routine general medical examination at a health care facility    -  Primary   Vaccines up to dae. Screening labs checked today. Pap and colonoscopy up to date. Mammogram ordered. Continue diet and exercise. Call with any concerns.   Relevant Orders   CBC with Differential/Platelet   Comprehensive metabolic panel with GFR   Lipid Panel w/o Chol/HDL Ratio   TSH   Hepatitis B surface antibody,quantitative     Elevated blood pressure reading without diagnosis of hypertension       Doing well today. Continue to monitor. Call with any concerns.   Relevant Orders   Microalbumin, Urine Waived     Encounter for screening mammogram for malignant neoplasm of breast       Mammogram ordered today.   Relevant Orders   MM 3D SCREENING MAMMOGRAM BILATERAL BREAST     Need for pneumococcal vaccine       Prevnar given today.   Relevant Orders   Pneumococcal conjugate vaccine 20-valent (Prevnar 20) (Completed)        Follow up plan: Return in about 6 weeks (around  02/11/2025).   LABORATORY TESTING:  - Pap smear: up to date  IMMUNIZATIONS:   - Tdap: Tetanus vaccination status reviewed: last tetanus booster within 10 years. - Influenza: Up to date - Prevnar: Administered today - COVID: Refused - HPV: Not applicable - Shingrix  vaccine: Up to date  SCREENING: -Mammogram: Ordered today  - Colonoscopy: Up to date   PATIENT COUNSELING:   Advised to take 1 mg of  folate supplement per day if capable of pregnancy.   Sexuality: Discussed sexually transmitted diseases, partner selection, use of condoms, avoidance of unintended pregnancy  and contraceptive alternatives.   Advised to avoid cigarette smoking.  I discussed with the patient that most people either abstain from alcohol or drink within safe limits (<=14/week and <=4 drinks/occasion for males, <=7/weeks and <= 3 drinks/occasion for females) and that the risk for alcohol disorders and other health effects rises proportionally with the number of drinks per week and how often a drinker exceeds daily limits.  Discussed cessation/primary prevention of drug use and availability of treatment for abuse.   Diet: Encouraged to adjust caloric intake to maintain  or achieve ideal body weight, to reduce intake of dietary saturated fat and total fat, to limit sodium intake by avoiding high sodium foods and not adding table salt, and to maintain adequate dietary potassium and calcium preferably from fresh fruits, vegetables, and low-fat dairy products.    stressed the importance of regular exercise  Injury prevention: Discussed safety belts, safety helmets, smoke detector, smoking near bedding or upholstery.   Dental health: Discussed importance of regular tooth brushing, flossing, and dental visits.    NEXT PREVENTATIVE PHYSICAL DUE IN 1 YEAR. Return in about 6 weeks (around 02/11/2025).              [1]  Allergies Allergen Reactions   Iodine Itching   Betadine [Povidone Iodine] Swelling  and Rash   Fire Ant Hives   Omeprazole  Rash    Pt takes Prilosec with no problems   Shellfish Allergy Rash  [2]  Social History Tobacco Use  Smoking Status Never  Smokeless Tobacco Never   "

## 2024-12-31 NOTE — Patient Instructions (Addendum)
 [email or web address] Text SAVE to [phone]  Please call to schedule your mammogram and/or bone density: [facility]  Address: [address], [place], [place] [address] Phone: [phone]  [facility] [address] [place],  [place]  [address] Phone: [phone]

## 2025-01-01 ENCOUNTER — Ambulatory Visit: Payer: Self-pay | Admitting: Family Medicine

## 2025-01-01 LAB — CBC WITH DIFFERENTIAL/PLATELET
Basophils Absolute: 0.1 x10E3/uL (ref 0.0–0.2)
Basos: 1 %
EOS (ABSOLUTE): 0.1 x10E3/uL (ref 0.0–0.4)
Eos: 1 %
Hematocrit: 41.2 % (ref 34.0–46.6)
Hemoglobin: 13.6 g/dL (ref 11.1–15.9)
Immature Grans (Abs): 0 x10E3/uL (ref 0.0–0.1)
Immature Granulocytes: 0 %
Lymphocytes Absolute: 1.8 x10E3/uL (ref 0.7–3.1)
Lymphs: 30 %
MCH: 32.8 pg (ref 26.6–33.0)
MCHC: 33 g/dL (ref 31.5–35.7)
MCV: 99 fL — ABNORMAL HIGH (ref 79–97)
Monocytes Absolute: 0.4 x10E3/uL (ref 0.1–0.9)
Monocytes: 8 %
Neutrophils Absolute: 3.4 x10E3/uL (ref 1.4–7.0)
Neutrophils: 60 %
Platelets: 317 x10E3/uL (ref 150–450)
RBC: 4.15 x10E6/uL (ref 3.77–5.28)
RDW: 12.3 % (ref 11.7–15.4)
WBC: 5.8 x10E3/uL (ref 3.4–10.8)

## 2025-01-01 LAB — LIPID PANEL W/O CHOL/HDL RATIO
Cholesterol, Total: 186 mg/dL (ref 100–199)
HDL: 77 mg/dL
LDL Chol Calc (NIH): 98 mg/dL (ref 0–99)
Triglycerides: 60 mg/dL (ref 0–149)
VLDL Cholesterol Cal: 11 mg/dL (ref 5–40)

## 2025-01-01 LAB — COMPREHENSIVE METABOLIC PANEL WITH GFR
ALT: 35 IU/L — ABNORMAL HIGH (ref 0–32)
AST: 30 IU/L (ref 0–40)
Albumin: 3.8 g/dL — ABNORMAL LOW (ref 3.9–4.9)
Alkaline Phosphatase: 69 IU/L (ref 41–116)
BUN/Creatinine Ratio: 7 — ABNORMAL LOW (ref 9–23)
BUN: 6 mg/dL (ref 6–24)
Bilirubin Total: 0.6 mg/dL (ref 0.0–1.2)
CO2: 23 mmol/L (ref 20–29)
Calcium: 8.9 mg/dL (ref 8.7–10.2)
Chloride: 102 mmol/L (ref 96–106)
Creatinine, Ser: 0.9 mg/dL (ref 0.57–1.00)
Globulin, Total: 3.3 g/dL (ref 1.5–4.5)
Glucose: 77 mg/dL (ref 70–99)
Potassium: 4.2 mmol/L (ref 3.5–5.2)
Sodium: 136 mmol/L (ref 134–144)
Total Protein: 7.1 g/dL (ref 6.0–8.5)
eGFR: 78 mL/min/1.73

## 2025-01-01 LAB — HEPATITIS B SURFACE ANTIBODY, QUANTITATIVE: Hepatitis B Surf Ab Quant: <3.5 m[IU]/mL — ABNORMAL LOW

## 2025-01-01 LAB — VITAMIN D 25 HYDROXY (VIT D DEFICIENCY, FRACTURES): Vit D, 25-Hydroxy: 47.7 ng/mL (ref 30.0–100.0)

## 2025-01-01 LAB — TSH: TSH: 1.54 u[IU]/mL (ref 0.450–4.500)

## 2025-01-16 NOTE — Progress Notes (Signed)
 "   01/17/2025 Bailey Perry [id number] Oct 12, 1976  Gastroenterology Office Note    Referring Provider: Vicci Duwaine SQUIBB, DO Primary Care Physician:  Vicci Duwaine SQUIBB, DO  Primary GI Provider: Celestia Rima, NP; Jinny Carmine, MD    Chief Complaint   Chief Complaint  Patient presents with   New Patient (Initial Visit)    hX gerd- does not take any medicine-watching her diet Constipation-BM every 3 days takes stool softeners no blood     History of Present Illness   Bailey Perry is a 49 y.o. female with PMHX of Bailey Perry, esophageal dysphagia, constipation presenting today at the request of Vicci, Megan P, DO due to chronic Gerd.  Discussed the use of AI scribe software for clinical note transcription with the patient, who gave verbal consent to proceed.  The patient reports recurrent heartburn with a burning sensation and a feeling of a knot in the chest, sometimes associated with difficulty breathing. Episodes are intermittent, typically lasting several days, and may be triggered by white rice, spicy foods, greasy foods, certain medications such as Excedrin, and occasionally citrus fruits. The patient reports pain in the chest and back during heartburn episodes. The patient reports difficulty swallowing certain foods during heartburn episodes, sometimes requiring regurgitation. Over-the-counter antacids such as Tums and Maalox are ineffective. Heartburn medication is used as needed, not daily. No heartburn episodes occurred between the time the appointment was made and the night before the visit.  A small sliding hiatal hernia was identified on upper GI series in July 2020. Previous treatment included Protonix, discontinued due to side effects, and famotidine, which was stopped due to concerns about vision changes. No prior upper endoscopy has been performed.  Chronic constipation is present, with bowel movements every three days. Constipation has worsened since starting  Wegovy less than a month ago. Stool softeners and other medications for constipation are not used regularly, and stimulant laxatives are avoided due to work constraints. Diet is low in fiber, fruits, and vegetables. No straining, rectal bleeding, abdominal pain, nausea, or vomiting reported.  Current medications include Wegovy, prescribed for sleep apnea. Ozempic was previously prescribed for sleep apnea but was not covered by insurance. No significant dietary changes have been made since starting Wegovy; sweet tea and soda are consumed regularly, and water intake is absent.   12/14/2022 Colonoscopy - One 5 mm polyp in the ascending colon, removed with a cold snare. Resected and retrieved.  - Diverticulosis in the left colon.  - The examination was otherwise normal on direct and retroflexion views - repeat in 5 years  07/15/2019 UGI 1. Small, sliding hiatal hernia with small volume reflux to the midesophagus with provocation maneuvers. 2.  Otherwise unremarkable double contrast upper GI examination.  Past Medical History:  Diagnosis Date   Abnormal Pap smear of cervix    Anemia    with pregnancy only 27 years ago   GERD (gastroesophageal reflux disease)    Hiatal hernia     Past Surgical History:  Procedure Laterality Date   APPENDECTOMY     CESAREAN SECTION     X 2   COLONOSCOPY WITH PROPOFOL  N/A 12/14/2022   Procedure: COLONOSCOPY WITH PROPOFOL ;  Surgeon: Therisa Bi, MD;  Location: [facility] ENDOSCOPY;  Service: Gastroenterology;  Laterality: N/A;   KNEE ARTHROSCOPY Left 03/16/2022   Procedure: LEFT KNEE ARTHROSCOPY WITH DEBRIDEMENT AND EXCISION OF SYMPTOMATIC MEDIAL PLICA;  Surgeon: Edie Norleen JINNY, MD;  Location: [facility] ORS;  Service: Orthopedics;  Laterality: Left;  Current Outpatient Medications  Medication Sig Dispense Refill   Cholecalciferol (VITAMIN D -1000 MAX ST) 25 MCG (1000 UT) tablet Take by mouth.     clotrimazole -betamethasone  (LOTRISONE ) cream Apply 1 Application  topically daily. 45 g 1   norethindrone-ethinyl estradiol-FE (JUNEL  FE 1/20) 1-20 MG-MCG tablet Take 1 tablet by mouth daily. 84 tablet 3   Rimegepant Sulfate (NURTEC) 75 MG TBDP Take 1 tablet (75 mg total) by mouth daily as needed (migraine). 9 tablet 12   WEGOVY 1.5 MG tablet Take 1.5 mg by mouth daily.     No current facility-administered medications for this visit.    Allergies as of 01/17/2025 - Review Complete 01/17/2025  Allergen Reaction Noted   Iodine Itching 10/23/2015   Betadine [povidone iodine] Swelling and Rash 01/18/2021   Fire ant Hives 07/08/2024   Omeprazole  Rash 05/16/2019   Shellfish allergy Rash 10/11/2016    Family History  Problem Relation Age of Onset   Diabetes Mother    Heart disease Mother    Hypertension Mother    Cancer Father        Lymphoma   Arthritis Paternal Grandfather     Social History   Socioeconomic History   Marital status: Married    Spouse name: Not on file   Number of children: Not on file   Years of education: Not on file   Highest education level: 12th grade  Occupational History   Not on file  Tobacco Use   Smoking status: Never   Smokeless tobacco: Never  Vaping Use   Vaping status: Never Used  Substance and Sexual Activity   Alcohol use: Never   Drug use: Never   Sexual activity: Yes    Birth control/protection: Pill  Other Topics Concern   Not on file  Social History Narrative   Not on file   Social Drivers of Health   Tobacco Use: Low Risk (01/17/2025)   Patient History    Smoking Tobacco Use: Never    Smokeless Tobacco Use: Never    Passive Exposure: Not on file  Financial Resource Strain: Low Risk (12/31/2024)   Overall Financial Resource Strain (CARDIA)    Difficulty of Paying Living Expenses: Not hard at all  Food Insecurity: No Food Insecurity (12/31/2024)   Epic    Worried About Programme Researcher, Broadcasting/film/video in the Last Year: Never true    Ran Out of Food in the Last Year: Never true  Transportation Needs: No  Transportation Needs (12/31/2024)   Epic    Lack of Transportation (Medical): No    Lack of Transportation (Non-Medical): No  Physical Activity: Inactive (12/31/2024)   Exercise Vital Sign    Days of Exercise per Week: 0 days    Minutes of Exercise per Session: Not on file  Stress: No Stress Concern Present (12/31/2024)   [employer] of Occupational Health - Occupational Stress Questionnaire    Feeling of Stress: Only a little  Social Connections: Socially Integrated (12/31/2024)   Social Connection and Isolation Panel    Frequency of Communication with Friends and Family: More than three times a week    Frequency of Social Gatherings with Friends and Family: Once a week    Attends Religious Services: 1 to 4 times per year    Active Member of [employer] or Organizations: Yes    Attends Banker Meetings: More than 4 times per year    Marital Status: Married  Catering Manager Violence: Not At Risk (12/31/2024)   Epic  Fear of Current or Ex-Partner: No    Emotionally Abused: No    Physically Abused: No    Sexually Abused: No  Depression (PHQ2-9): Low Risk (11/05/2024)   Depression (PHQ2-9)    PHQ-2 Score: 0  Alcohol Screen: Low Risk (12/31/2024)   Alcohol Screen    Last Alcohol Screening Score (AUDIT): 0  Housing: Low Risk (12/31/2024)   Epic    Unable to Pay for Housing in the Last Year: No    Number of Times Moved in the Last Year: 0    Homeless in the Last Year: No  Utilities: Not on file  Health Literacy: Not on file     RELEVANT GI HISTORY, IMAGING AND LABS: CBC    Component Value Date/Time   WBC 5.8 12/31/2024 0928   WBC 5.3 01/18/2021 0930   RBC 4.15 12/31/2024 0928   RBC 4.17 01/18/2021 0930   HGB 13.6 12/31/2024 0928   HCT 41.2 12/31/2024 0928   PLT 317 12/31/2024 0928   MCV 99 (H) 12/31/2024 0928   MCH 32.8 12/31/2024 0928   MCH 32.4 01/18/2021 0930   MCHC 33.0 12/31/2024 0928   MCHC 33.8 01/18/2021 0930   RDW 12.3 12/31/2024 0928   LYMPHSABS  1.8 12/31/2024 0928   EOSABS 0.1 12/31/2024 0928   BASOSABS 0.1 12/31/2024 0928   Recent Labs    12/31/24 0928  HGB 13.6    CMP     Component Value Date/Time   NA 136 12/31/2024 0928   K 4.2 12/31/2024 0928   CL 102 12/31/2024 0928   CO2 23 12/31/2024 0928   GLUCOSE 77 12/31/2024 0928   GLUCOSE 87 01/18/2021 0930   BUN 6 12/31/2024 0928   CREATININE 0.90 12/31/2024 0928   CALCIUM 8.9 12/31/2024 0928   PROT 7.1 12/31/2024 0928   ALBUMIN 3.8 (L) 12/31/2024 0928   AST 30 12/31/2024 0928   ALT 35 (H) 12/31/2024 0928   ALKPHOS 69 12/31/2024 0928   BILITOT 0.6 12/31/2024 0928   GFRNONAA >60 01/18/2021 0930   GFRAA 92 12/09/2020 1609      Latest Ref Rng & Units 12/31/2024    9:28 AM 12/26/2023   10:43 AM 11/29/2022   11:03 AM  Hepatic Function  Total Protein 6.0 - 8.5 g/dL 7.1  7.2  7.1   Albumin 3.9 - 4.9 g/dL 3.8  3.9  3.9   AST 0 - 40 IU/L 30  17  22    ALT 0 - 32 IU/L 35  14  29   Alk Phosphatase 41 - 116 IU/L 69  81  71   Total Bilirubin 0.0 - 1.2 mg/dL 0.6  0.5  0.4       Review of Systems   All systems reviewed and negative except where noted in HPI.    Physical Exam  BP 117/78   Pulse 75   Temp 98.4 F (36.9 C)   Ht 5' 3 (1.6 m)   Wt 223 lb 6.4 oz (101.3 kg)   LMP 01/10/2025   SpO2 99%   BMI 39.57 kg/m  Patient's last menstrual period was 01/10/2025. General:   Alert and oriented. Pleasant and cooperative. Well-nourished and well-developed. In no acute distress.  Head:  Normocephalic and atraumatic. Eyes:  Without icterus Ears:  Normal auditory acuity. Neck:  Supple; no masses or thyromegaly. Lungs:  Respirations even and unlabored.  Clear throughout to auscultation.   No wheezes, crackles, or rhonchi. No acute distress. Heart:  Regular rate and rhythm; no murmurs,  clicks, rubs, or gallops. Abdomen:  Normal bowel sounds.  No bruits.  Soft, non-tender and non-distended without masses, hepatosplenomegaly or hernias noted.  No guarding or rebound  tenderness.   Rectal:  Deferred. Msk:  Symmetrical without gross deformities. Normal posture. Extremities:  Without edema. Neurologic:  Alert and  oriented x4;  grossly normal neurologically. Skin:  Intact without significant lesions or rashes. Psych:  Alert and cooperative. Normal mood and affect.   Assessment & Plan   Bailey Perry is a 49 y.o. female presenting today with chronic GERD and constipation.   GERD - Lifestyle changes discussed: avoid trigger foods, caffeine, carbonation, NSAIDS, ETOH; do not eat meals 3 hr prior to bed. - Weight loss discussed with the patient - Discussed use of OTC famotidine as needed, option for omeprazole ; noted as-needed use may be less effective than daily dosing. - Schedule EGD in the near future. I discussed risks of EGD with patient today, including risk of sedation, bleeding or perforation. Patient provides understanding and gave verbal consent to proceed.  Constipation -Recommend High Fiber diet with fruits, vegetables, and whole grains. -Drink 64 ounces of Fluids Daily. - Start Miralax Mix 1 capful in a drink daily. - Start Benefiber Mix 1 TBSP in a drink daily. - If no improvement, consider Linzess, Amitiza  or Trulance.   Follow up in 2 months.   Grayce Bohr, DNP, AGNP-C [facility]  "

## 2025-01-17 ENCOUNTER — Encounter: Payer: Self-pay | Admitting: Family Medicine

## 2025-01-17 ENCOUNTER — Ambulatory Visit: Admitting: Family Medicine

## 2025-01-17 VITALS — BP 117/78 | HR 75 | Temp 98.4°F | Ht 63.0 in | Wt 223.4 lb

## 2025-01-17 DIAGNOSIS — K219 Gastro-esophageal reflux disease without esophagitis: Secondary | ICD-10-CM | POA: Diagnosis not present

## 2025-01-17 DIAGNOSIS — K5900 Constipation, unspecified: Secondary | ICD-10-CM

## 2025-01-17 DIAGNOSIS — K449 Diaphragmatic hernia without obstruction or gangrene: Secondary | ICD-10-CM

## 2025-01-17 DIAGNOSIS — K5909 Other constipation: Secondary | ICD-10-CM

## 2025-01-17 NOTE — Patient Instructions (Addendum)
 For heartburn, you can take over-the-counter famotidine (Pepcid) up to twice a day.   Can take dulcolax stimulant laxative to get your bowel moving.     Start OTC Benefiber Powder. Mix 1 - 2 Tablespoons in 6 - 8 ounces of a Drink Once Daily. Drink 64 ounces of water / fluids Daily.   For constipation: Start OTC Miralax Powder Mix 1/2- 1 capful in 6 to 8 ounces of a drink once daily  Recommend high-fiber diet, 30 g of fiber daily Eat fruits, vegetables, and whole grains

## 2025-01-20 ENCOUNTER — Encounter: Payer: Self-pay | Admitting: Family Medicine

## 2025-01-24 ENCOUNTER — Ambulatory Visit: Admission: RE | Admit: 2025-01-24 | Source: Ambulatory Visit

## 2025-01-24 DIAGNOSIS — Z1231 Encounter for screening mammogram for malignant neoplasm of breast: Secondary | ICD-10-CM

## 2025-01-27 ENCOUNTER — Encounter

## 2025-02-07 ENCOUNTER — Ambulatory Visit

## 2025-02-10 ENCOUNTER — Ambulatory Visit: Admit: 2025-02-10

## 2025-02-24 ENCOUNTER — Ambulatory Visit: Admitting: Family Medicine

## 2025-03-18 ENCOUNTER — Ambulatory Visit: Admitting: Family Medicine
